# Patient Record
Sex: Male | Born: 1950
Health system: Southern US, Community
[De-identification: ages and names within clinical notes are randomized; demographics above are authoritative.]

## PROBLEM LIST (undated history)

## (undated) DIAGNOSIS — M1990 Unspecified osteoarthritis, unspecified site: Secondary | ICD-10-CM

## (undated) DIAGNOSIS — K4091 Unilateral inguinal hernia, without obstruction or gangrene, recurrent: Secondary | ICD-10-CM

## (undated) DIAGNOSIS — J189 Pneumonia, unspecified organism: Secondary | ICD-10-CM

## (undated) DIAGNOSIS — J339 Nasal polyp, unspecified: Secondary | ICD-10-CM

## (undated) DIAGNOSIS — I351 Nonrheumatic aortic (valve) insufficiency: Secondary | ICD-10-CM

## (undated) DIAGNOSIS — Z9889 Other specified postprocedural states: Secondary | ICD-10-CM

## (undated) DIAGNOSIS — R112 Nausea with vomiting, unspecified: Secondary | ICD-10-CM

## (undated) DIAGNOSIS — J45909 Unspecified asthma, uncomplicated: Secondary | ICD-10-CM

## (undated) HISTORY — DX: Unilateral inguinal hernia, without obstruction or gangrene, recurrent: K40.91

## (undated) HISTORY — PX: NASAL POLYP EXCISION: SHX2068

## (undated) HISTORY — DX: Unspecified asthma, uncomplicated: J45.909

## (undated) HISTORY — PX: EYE SURGERY: SHX253

## (undated) HISTORY — PX: NASAL SEPTUM SURGERY: SHX37

## (undated) HISTORY — DX: Nonrheumatic aortic (valve) insufficiency: I35.1

## (undated) HISTORY — PX: TRIGEMINAL NERVE DECOMPRESSION: SHX2579

## (undated) HISTORY — PX: KNEE SURGERY: SHX244

## (undated) HISTORY — DX: Nasal polyp, unspecified: J33.9

## (undated) HISTORY — PX: INGUINAL HERNIA REPAIR: SUR1180

---
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
## 2001-08-26 HISTORY — PX: OTHER SURGICAL HISTORY: SHX169

## 2006-08-26 HISTORY — PX: REFRACTIVE SURGERY: SHX103

## 2008-10-19 HISTORY — PX: CATARACT EXTRACTION W/ INTRAOCULAR LENS IMPLANT: SHX1309

## 2008-11-09 HISTORY — PX: CATARACT EXTRACTION W/ INTRAOCULAR LENS IMPLANT: SHX1309

## 2017-08-26 HISTORY — PX: XI ROBOTIC ASSISTED INGUINAL HERNIA REPAIR WITH MESH: SHX6706

## 2019-07-16 ENCOUNTER — Ambulatory Visit: Payer: Self-pay | Admitting: Family Medicine

## 2019-08-09 ENCOUNTER — Other Ambulatory Visit: Payer: Self-pay

## 2019-08-09 ENCOUNTER — Encounter: Payer: Self-pay | Admitting: Nurse Practitioner

## 2019-08-09 ENCOUNTER — Ambulatory Visit (INDEPENDENT_AMBULATORY_CARE_PROVIDER_SITE_OTHER): Payer: Medicare Other | Admitting: Nurse Practitioner

## 2019-08-09 VITALS — BP 132/76 | HR 88 | Temp 98.4°F | Ht 71.0 in | Wt 196.4 lb

## 2019-08-09 DIAGNOSIS — J310 Chronic rhinitis: Secondary | ICD-10-CM

## 2019-08-09 DIAGNOSIS — Z9189 Other specified personal risk factors, not elsewhere classified: Secondary | ICD-10-CM

## 2019-08-09 DIAGNOSIS — M170 Bilateral primary osteoarthritis of knee: Secondary | ICD-10-CM

## 2019-08-09 DIAGNOSIS — J4520 Mild intermittent asthma, uncomplicated: Secondary | ICD-10-CM

## 2019-08-09 DIAGNOSIS — G4733 Obstructive sleep apnea (adult) (pediatric): Secondary | ICD-10-CM

## 2019-08-09 DIAGNOSIS — E559 Vitamin D deficiency, unspecified: Secondary | ICD-10-CM

## 2019-08-09 DIAGNOSIS — Q676 Pectus excavatum: Secondary | ICD-10-CM

## 2019-08-09 DIAGNOSIS — I340 Nonrheumatic mitral (valve) insufficiency: Secondary | ICD-10-CM

## 2019-08-09 DIAGNOSIS — R030 Elevated blood-pressure reading, without diagnosis of hypertension: Secondary | ICD-10-CM | POA: Diagnosis not present

## 2019-08-09 DIAGNOSIS — I351 Nonrheumatic aortic (valve) insufficiency: Secondary | ICD-10-CM | POA: Insufficient documentation

## 2019-08-09 DIAGNOSIS — I872 Venous insufficiency (chronic) (peripheral): Secondary | ICD-10-CM

## 2019-08-09 DIAGNOSIS — Z9889 Other specified postprocedural states: Secondary | ICD-10-CM | POA: Insufficient documentation

## 2019-08-09 DIAGNOSIS — E538 Deficiency of other specified B group vitamins: Secondary | ICD-10-CM

## 2019-08-09 DIAGNOSIS — Z800 Family history of malignant neoplasm of digestive organs: Secondary | ICD-10-CM

## 2019-08-09 DIAGNOSIS — Z8719 Personal history of other diseases of the digestive system: Secondary | ICD-10-CM

## 2019-08-09 DIAGNOSIS — J339 Nasal polyp, unspecified: Secondary | ICD-10-CM

## 2019-08-09 DIAGNOSIS — J449 Chronic obstructive pulmonary disease, unspecified: Secondary | ICD-10-CM | POA: Insufficient documentation

## 2019-08-09 NOTE — Progress Notes (Signed)
New Patient Office Visit  Subjective:  Patient ID: Richard Wolfe, male    DOB: 1951/01/17  Age: 68 y.o. MRN: [id number]  CC:  Chief Complaint  Patient presents with  . Establish Care  . Referral    patient requesting referrals to cardio and pulmonary    HPI Richard Wolfe presents for new patient visit to establish care.  Introduced to Publishing rights manager role and practice setting.  All questions answered. Recently moved here from [place], [place], brought records with him.  His previous PCP has written up a provider hand off with pertinent information.   Retired Metallurgist.  Would like referrals today to cardiologist, pulmonary, and ENT, all who were following him closely in [place].  Has three priorities after move into new house in February -- knee replacement bilaterally, colonoscopy (was told he was to have a colonoscopy every 5 years due to family history -- last 2018 & 2019 could not be done due to "not being clean enough"), and get his hernias looked at.  Does not want referrals for these issues or procedures until closer to February.  AORTIC INSUFFICIENCY: Was followed by cardiology in [place] and told to continue this.  On notes it reports moderate to severe aortic insufficiency, "echo in 2018 showed 65% EF with mild to moderate concentric left ventricular hypertrophy, borderline left atrial enlargement, mild to moderate mitral regurgitation and moderate tricuspid regurgitation".  Was to have repeat echo in 2020, but has not obtained this as of yet.  Reports having white coat syndrome.  BP at home 120-130/70's. Aspirin: no Recurrent headaches: no Visual changes: no Palpitations: no Dyspnea: no Chest pain: no Lower extremity edema: no Dizzy/lightheaded: no  ASTHMA, MILD, INTERMITTENT Was followed by pulmonary in [place] for asthma.  Has had this since childhood.  Never had any asthma related issues until 4-5 years ago, is a runner  and felt like he was not getting enough oxygen and then was sent to pulmonary.  Had PFTs once a year and was told test results showed he had moderate blockage.  Uses Symbicort inhaler daily. Has Albuterol inhaler, but rarely uses.  States he fills it once a year, but only uses once or twice a year.   Does not use CPAP, has not used in 1 1/2 to 2 years.  Is aware he would benefit from daily use of this. Asthma status: stable Satisfied with current treatment?: yes Albuterol/rescue inhaler frequency: once or twice a year Dyspnea frequency: none Wheezing frequency: none Cough frequency: none Nocturnal symptom frequency: none Limitation of activity: no Current upper respiratory symptoms: no Triggers: exercise Home peak flows:none Last Spirometry:  Failed/intolerant to following asthma meds:  Asthma meds in past:  Aerochamber/spacer use: no Visits to ER or Urgent Care in past year: no Pneumovax: Up to Date Influenza: Up to Date   NASAL POLYPS & CHRONIC RHINITIS: Had to have nasal polyps removed every 18 months, now uses Neti Pot and Pulmicort neb, which his ENT started, and has decreased the amount of polyps he has.  Has not had nasal polyp surgery since 2000.  Was followed by ENT in [place] and requests to establish care with ENT locally in case polyp presents in future.  INGUINAL AND UMBILICAL HERNIA: Had triple hernia surgery June 19, 2018, inguinal and umbilical hernias.  Since that time has been gradually feeling pressure when he sits down, swelling, pulling on left side, and feeling like someone stuffed rags under skin.  L>R, left with  tenderness at times.  Does endorse lots of lifting due to moving over past months.  Does wear supportive underwear daily.  Would like to get this assessed, but prefers not to have assessment until February after has moved into new home and has more time.  PERIPHERAL VENOUS INSUFFICIENCY: Wears compression socks, his previous PCP told him he has good  circulation.  Has varicose veins bilaterally.  Is religious about wearing compression socks all the time, which has improved overall edema and veins.   ROSACEA: Takes Doxycycline as maintenance for rosacea.  Has not had flare up since starting abx for control several years ago.   Itching: no Burning: no Redness: no Oozing: no Scaling: no Blisters: no Painful: no Fevers: no   OSTEOARTHRITIS BOTH KNEES: Per previous PCP notes this has been slowly progressing and impacts patient's abilities to perform daily ADLs.  He was followed by Dr. Lars Pinks in [place] and per patient ortho recommended to forgo injections and instead have bilateral knee replacement.  Patient would like referral to ortho in future, after his move in February, for further assessment and to schedule replacement.  At this time uses intermittent Tylenol for discomfort and remains active to avoid complications.  Endorses being a very active person, bowling, running, and working outside.  VITAMIN B12 & D DEFICIENCY: Continues on supplements for both.  No recent fractures or falls.  Reports history of low levels of Vit D and B12.     FAMILY HISTORY OF COLON CANCER: Father and brother with history of this.  Per patient he is to have colonoscopy every 5 years and was unable to obtain these past two years due to "not being clean enough".  Has history of colon polyps on past tests.  Would like referral in future, in February, for GI to continue ongoing assessment.  Reports regular bowel pattern.  Denies melena, diarrhea, abdominal pain, constipation, hemorrhoids, or N&V.  Past Medical History:  Diagnosis Date  . Hernia, inguinal, recurrent   . Nasal polyps     Past Surgical History:  Procedure Laterality Date  . CATARACT EXTRACTION W/ INTRAOCULAR LENS IMPLANT Left 10/19/2008  . CATARACT EXTRACTION W/ INTRAOCULAR LENS IMPLANT Right 11/09/2008  . INGUINAL HERNIA REPAIR Left    1999  . INGUINAL HERNIA REPAIR Right    2019  . NASAL  POLYP EXCISION     1981, 1986, 1993, 1994, 1995, 1996, 1998, 1999, 2001  . NASAL SEPTUM SURGERY     1981  . REFRACTIVE SURGERY  2008  . trigeminal nerve block  2003  . TRIGEMINAL NERVE DECOMPRESSION  2002, 2003  . WISDOM TOOTH EXTRACTION  1967  . XI ROBOTIC ASSISTED INGUINAL HERNIA REPAIR WITH MESH Left 2019    Family History  Problem Relation Age of Onset  . Rheum arthritis Mother   . Cancer Father        liver  . Arthritis Brother   . Cancer Paternal Grandfather        throat  . Cancer Brother        liver    Social History   Socioeconomic History  . Marital status: Married    Spouse name: Not on file  . Number of children: Not on file  . Years of education: Not on file  . Highest education level: Not on file  Occupational History  . Not on file  Tobacco Use  . Smoking status: Never Smoker  . Smokeless tobacco: Never Used  Substance and Sexual Activity  . Alcohol  use: Never  . Drug use: Never  . Sexual activity: Yes  Other Topics Concern  . Not on file  Social History Narrative  . Not on file   Social Determinants of Health   Financial Resource Strain: Low Risk   . Difficulty of Paying Living Expenses: Not hard at all  Food Insecurity: No Food Insecurity  . Worried About Programme researcher, broadcasting/film/video in the Last Year: Never true  . Ran Out of Food in the Last Year: Never true  Transportation Needs: No Transportation Needs  . Lack of Transportation (Medical): No  . Lack of Transportation (Non-Medical): No  Physical Activity: Sufficiently Active  . Days of Exercise per Week: 5 days  . Minutes of Exercise per Session: 30 min  Stress: No Stress Concern Present  . Feeling of Stress : Not at all  Social Connections: Somewhat Isolated  . Frequency of Communication with Friends and Family: More than three times a week  . Frequency of Social Gatherings with Friends and Family: More than three times a week  . Attends Religious Services: Never  . Active Member of Clubs or  Organizations: No  . Attends Banker Meetings: Never  . Marital Status: Married  Catering manager Violence: Not At Risk  . Fear of Current or Ex-Partner: No  . Emotionally Abused: No  . Physically Abused: No  . Sexually Abused: No    ROS Review of Systems  Constitutional: Negative for activity change, diaphoresis, fatigue and fever.  Respiratory: Negative for cough, chest tightness, shortness of breath and wheezing.   Cardiovascular: Negative for chest pain, palpitations and leg swelling.  Gastrointestinal: Negative for abdominal distention, abdominal pain, constipation, diarrhea, nausea and vomiting.  Endocrine: Negative for cold intolerance and heat intolerance.  Musculoskeletal: Positive for arthralgias.  Neurological: Negative.   Psychiatric/Behavioral: Negative.     Objective:   Today's Vitals: BP 132/76 (BP Location: Right Arm)   Pulse 88   Temp 98.4 F (36.9 C) (Oral)   Ht  (1.803 m)   Wt 196 lb 6.4 oz (89.1 kg)   SpO2 95%   BMI 27.39 kg/m   Physical Exam Vitals and nursing note reviewed.  Constitutional:      General: He is awake. He is not in acute distress.    Appearance: He is well-developed. He is not ill-appearing.  HENT:     Head: Normocephalic and atraumatic.     Right Ear: Hearing normal. No drainage.     Left Ear: Hearing normal. No drainage.  Eyes:     General: Lids are normal.        Right eye: No discharge.        Left eye: No discharge.     Conjunctiva/sclera: Conjunctivae normal.     Pupils: Pupils are equal, round, and reactive to light.  Neck:     Thyroid: No thyromegaly.     Vascular: No carotid bruit.  Cardiovascular:     Rate and Rhythm: Normal rate and regular rhythm.     Heart sounds: S1 normal and S2 normal. Murmur present. Systolic murmur present with a grade of 1/6. No gallop.      Comments: Compression hose in place. Pulmonary:     Effort: Pulmonary effort is normal. No accessory muscle usage or respiratory  distress.     Breath sounds: Normal breath sounds.     Comments: Slight pectus excavatum noted. Abdominal:     General: Bowel sounds are normal.     Palpations:  Abdomen is soft. There is no hepatomegaly or splenomegaly.     Tenderness: There is no abdominal tenderness.     Comments: Mild swelling to bilateral inguinal area with no tenderness on palpation and no masses noted.    Musculoskeletal:        General: Normal range of motion.     Cervical back: Normal range of motion and neck supple.     Right lower leg: No edema.     Left lower leg: No edema.  Lymphadenopathy:     Head:     Right side of head: No submental, submandibular, tonsillar, preauricular or posterior auricular adenopathy.     Left side of head: No submental, submandibular, tonsillar, preauricular or posterior auricular adenopathy.  Skin:    General: Skin is warm and dry.     Capillary Refill: Capillary refill takes less than 2 seconds.     Findings: No rash.  Neurological:     Mental Status: He is alert and oriented to person, place, and time.     Deep Tendon Reflexes: Reflexes are normal and symmetric.     Reflex Scores:      Brachioradialis reflexes are 2+ on the right side and 2+ on the left side.      Patellar reflexes are 2+ on the right side and 2+ on the left side. Psychiatric:        Attention and Perception: Attention normal.        Mood and Affect: Mood normal.        Speech: Speech normal.        Behavior: Behavior normal. Behavior is cooperative.        Thought Content: Thought content normal.        Judgment: Judgment normal.     Assessment & Plan:   Problem List Items Addressed This Visit      Cardiovascular and Mediastinum   White coat syndrome without diagnosis of hypertension    Initial BP elevated, but repeat below goal.  No current medications.  Continue diet focus.      Relevant Orders   Ambulatory referral to Cardiology   Aortic insufficiency - Primary    Was followed by  cardiology in [place].  Will place referral to cardiology to continue this collaboration per patient request.  Is due for repeat echo.      Relevant Orders   Ambulatory referral to Cardiology   Mild mitral regurgitation by prior echocardiogram    Followed by cardiology in [place] and he would like to continue this collaboration, will place referral per request.  No current symptoms.      Relevant Orders   Ambulatory referral to Cardiology   Peripheral venous insufficiency    Chronic, ongoing.  Continue use of compression hose daily and Tylenol as needed.  Consider vascular referral if any worsening.        Respiratory   Nasal polyps    None present in several years, but would like to establish care with ENT locally as was followed closely in [place].  Will place referral for ongoing collaboration.  Continue Pulmicort nebulizer and neti pot treatments.      Relevant Orders   Ambulatory referral to ENT   Chronic rhinitis    Followed by ENT in [place] and is requesting referral to continue this collaboration.  Referral placed.  Continue Pulmicort nebulizer and neti pot treatments.      Relevant Orders   Ambulatory referral to ENT   Ambulatory referral to Pulmonology  Mild intermittent asthma, uncomplicated    Mild, intermittent with no recent exacerbations.  Followed by pulmonary in [place] and is requesting referral to establish care with pulmonary locally.  Referral placed.  Continue Symbicort daily + Proventil as needed.  Perform PFT next visit.      Relevant Medications   budesonide (PULMICORT) 0.5 MG/2ML nebulizer solution   SYMBICORT 80-4.5 MCG/ACT inhaler   albuterol (VENTOLIN HFA) 108 (90 Base) MCG/ACT inhaler   Other Relevant Orders   Ambulatory referral to Pulmonology   OSA (obstructive sleep apnea)    Has not used CPAP in 1 1/2 to 2 years.  Have recommended he return to use of CPAP regularly for benefit to overall health.      Relevant Orders   Ambulatory referral to  Pulmonology     Musculoskeletal and Integument   Osteoarthritis of both knees    Ongoing, plan on imaging in upcoming month and patient wishes to have referral to ortho placed at next visit.  His goal is total knee replacement bilaterally in upcoming months.  Continue Tylenol as needed and regular exercise + stretching.      Pectus excavatum    Noted on exam today.  Continue to monitor.  No family history of this reported.        Other   Family history of colon cancer    Is due for screening colonoscopy, wishes to have referral placed next visit to GI.  Continue regular screening.      History of inguinal hernia repair    History of bilateral inguinal hernia and umbilical hernia repair in 2019.  He wishes to have this assessed due to mild return of previous symptoms.  Does not want referral this visit, but would like next visit.  Is aware to return to office immediately if any worsening symptoms prior to next visit.  Continue to wear support at home and attempt to avoid heavy lifting.      B12 deficiency    Check B12 level today and continue daily supplement.      Relevant Orders   Vitamin B12   CBC with Differential/Platelet out   Vitamin D deficiency    Check Vit D level today and continue daily supplement.      Relevant Orders   Vit D  25 hydroxy (rtn osteoporosis monitoring)    Other Visit Diagnoses    At risk for cardiac complication       Check CBC, lipid, and CMP today due to cardiac history.  He is fasting.   Relevant Orders   Comprehensive metabolic panel   Lipid Panel w/o Chol/HDL Ratio out      Outpatient Encounter Medications as of 08/09/2019  Medication Sig  . albuterol (VENTOLIN HFA) 108 (90 Base) MCG/ACT inhaler Inhale 2 puffs into the lungs every 6 (six) hours as needed.  . Ascorbic Acid (VITAMIN C) 1000 MG tablet Take 1,000 mg by mouth daily.  . budesonide (PULMICORT) 0.5 MG/2ML nebulizer solution Take by nebulization 2 (two) times daily.  .  Cholecalciferol (VITAMIN D3) 125 MCG (5000 UT) CAPS Take 1 capsule by mouth daily.  . DHA-EPA-Vitamin E (OMEGA-3 COMPLEX PO) Take 1,280 mg by mouth daily.  Marland Kitchen. doxycycline (PERIOSTAT) 20 MG tablet Take 20 mg by mouth 2 (two) times daily.  . Folic Acid (FOLATE PO) Take 400 mcg by mouth daily.  . Magnesium 400 MG CAPS Take 2 capsules by mouth daily.  . milk thistle 175 MG tablet Take 350 mg by mouth daily.  .Marland Kitchen  Misc Natural Products (JOINT HEALTH PO) Take 250 mg by mouth daily.  . SYMBICORT 80-4.5 MCG/ACT inhaler Inhale 1 puff into the lungs 2 (two) times daily.  . TURMERIC PO Take 20,000 Units by mouth daily.  Marland Kitchen UNABLE TO FIND Take 100 mg by mouth daily. Ubiquinal  . vitamin B-12 (CYANOCOBALAMIN) 1000 MCG tablet Take 1,000 mcg by mouth daily.   No facility-administered encounter medications on file as of 08/09/2019.    Follow-up: Return in about 5 weeks (around 09/13/2019) for Physical exam with labs.   Marjie Skiff, NP

## 2019-08-09 NOTE — Patient Instructions (Signed)
Asthma, Adult ° °Asthma is a long-term (chronic) condition in which the airways get tight and narrow. The airways are the breathing passages that lead from the nose and mouth down into the lungs. A person with asthma will have times when symptoms get worse. These are called asthma attacks. They can cause coughing, whistling sounds when you breathe (wheezing), shortness of breath, and chest pain. They can make it hard to breathe. There is no cure for asthma, but medicines and lifestyle changes can help control it. °There are many things that can bring on an asthma attack or make asthma symptoms worse (triggers). Common triggers include: °· Mold. °· Dust. °· Cigarette smoke. °· Cockroaches. °· Things that can cause allergy symptoms (allergens). These include animal skin flakes (dander) and pollen from trees or grass. °· Things that pollute the air. These may include household cleaners, wood smoke, smog, or chemical odors. °· Cold air, weather changes, and wind. °· Crying or laughing hard. °· Stress. °· Certain medicines or drugs. °· Certain foods such as dried fruit, potato chips, and grape juice. °· Infections, such as a cold or the flu. °· Certain medical conditions or diseases. °· Exercise or tiring activities. °Asthma may be treated with medicines and by staying away from the things that cause asthma attacks. Types of medicines may include: °· Controller medicines. These help prevent asthma symptoms. They are usually taken every day. °· Fast-acting reliever or rescue medicines. These quickly relieve asthma symptoms. They are used as needed and provide short-term relief. °· Allergy medicines if your attacks are brought on by allergens. °· Medicines to help control the body's defense (immune) system. °Follow these instructions at home: °Avoiding triggers in your home °· Change your heating and air conditioning filter often. °· Limit your use of fireplaces and wood stoves. °· Get rid of pests (such as roaches and  mice) and their droppings. °· Throw away plants if you see mold on them. °· Clean your floors. Dust regularly. Use cleaning products that do not smell. °· Have someone vacuum when you are not home. Use a vacuum cleaner with a HEPA filter if possible. °· Replace carpet with wood, tile, or vinyl flooring. Carpet can trap animal skin flakes and dust. °· Use allergy-proof pillows, mattress covers, and box spring covers. °· Wash bed sheets and blankets every week in hot water. Dry them in a dryer. °· Keep your bedroom free of any triggers. °· Avoid pets and keep windows closed when things that cause allergy symptoms are in the air. °· Use blankets that are made of polyester or cotton. °· Clean bathrooms and kitchens with bleach. If possible, have someone repaint the walls in these rooms with mold-resistant paint. Keep out of the rooms that are being cleaned and painted. °· Wash your hands often with soap and water. If soap and water are not available, use hand sanitizer. °· Do not allow anyone to smoke in your home. °General instructions °· Take over-the-counter and prescription medicines only as told by your doctor. °? Talk with your doctor if you have questions about how or when to take your medicines. °? Make note if you need to use your medicines more often than usual. °· Do not use any products that contain nicotine or tobacco, such as cigarettes and e-cigarettes. If you need help quitting, ask your doctor. °· Stay away from secondhand smoke. °· Avoid doing things outdoors when allergen counts are high and when air quality is low. °· Wear a ski mask   when doing outdoor activities in the winter. The mask should cover your nose and mouth. Exercise indoors on cold days if you can. °· Warm up before you exercise. Take time to cool down after exercise. °· Use a peak flow meter as told by your doctor. A peak flow meter is a tool that measures how well the lungs are working. °· Keep track of the peak flow meter's readings.  Write them down. °· Follow your asthma action plan. This is a written plan for taking care of your asthma and treating your attacks. °· Make sure you get all the shots (vaccines) that your doctor recommends. Ask your doctor about a flu shot and a pneumonia shot. °· Keep all follow-up visits as told by your doctor. This is important. °Contact a doctor if: °· You have wheezing, shortness of breath, or a cough even while taking medicine to prevent attacks. °· The mucus you cough up (sputum) is thicker than usual. °· The mucus you cough up changes from clear or white to yellow, green, gray, or bloody. °· You have problems from the medicine you are taking, such as: °? A rash. °? Itching. °? Swelling. °? Trouble breathing. °· You need reliever medicines more than 2-3 times a week. °· Your peak flow reading is still at 50-79% of your personal best after following the action plan for 1 hour. °· You have a fever. °Get help right away if: °· You seem to be worse and are not responding to medicine during an asthma attack. °· You are short of breath even at rest. °· You get short of breath when doing very little activity. °· You have trouble eating, drinking, or talking. °· You have chest pain or tightness. °· You have a fast heartbeat. °· Your lips or fingernails start to turn blue. °· You are light-headed or dizzy, or you faint. °· Your peak flow is less than 50% of your personal best. °· You feel too tired to breathe normally. °Summary °· Asthma is a long-term (chronic) condition in which the airways get tight and narrow. An asthma attack can make it hard to breathe. °· Asthma cannot be cured, but medicines and lifestyle changes can help control it. °· Make sure you understand how to avoid triggers and how and when to use your medicines. °This information is not intended to replace advice given to you by your health care provider. Make sure you discuss any questions you have with your health care provider. °Document  Released: 01/29/2008 Document Revised: 10/15/2018 Document Reviewed: 09/16/2016 °Elsevier Patient Education © 2020 [employer] ° °

## 2019-08-09 NOTE — Assessment & Plan Note (Signed)
Followed by cardiology in [place] and he would like to continue this collaboration, will place referral per request.  No current symptoms.

## 2019-08-09 NOTE — Assessment & Plan Note (Signed)
Noted on exam today.  Continue to monitor.  No family history of this reported.

## 2019-08-09 NOTE — Assessment & Plan Note (Signed)
Ongoing, plan on imaging in upcoming month and patient wishes to have referral to ortho placed at next visit.  His goal is total knee replacement bilaterally in upcoming months.  Continue Tylenol as needed and regular exercise + stretching.

## 2019-08-09 NOTE — Assessment & Plan Note (Signed)
None present in several years, but would like to establish care with ENT locally as was followed closely in [place].  Will place referral for ongoing collaboration.  Continue Pulmicort nebulizer and neti pot treatments.

## 2019-08-09 NOTE — Assessment & Plan Note (Signed)
Chronic, ongoing.  Continue use of compression hose daily and Tylenol as needed.  Consider vascular referral if any worsening.

## 2019-08-09 NOTE — Assessment & Plan Note (Signed)
Is due for screening colonoscopy, wishes to have referral placed next visit to GI.  Continue regular screening.

## 2019-08-09 NOTE — Assessment & Plan Note (Signed)
Has not used CPAP in 1 1/2 to 2 years.  Have recommended he return to use of CPAP regularly for benefit to overall health. 

## 2019-08-09 NOTE — Assessment & Plan Note (Signed)
Initial BP elevated, but repeat below goal.  No current medications.  Continue diet focus.

## 2019-08-09 NOTE — Assessment & Plan Note (Signed)
Followed by ENT in [place] and is requesting referral to continue this collaboration.  Referral placed.  Continue Pulmicort nebulizer and neti pot treatments.

## 2019-08-09 NOTE — Assessment & Plan Note (Signed)
Mild, intermittent with no recent exacerbations.  Followed by pulmonary in [place] and is requesting referral to establish care with pulmonary locally.  Referral placed.  Continue Symbicort daily + Proventil as needed.  Perform PFT next visit.

## 2019-08-09 NOTE — Assessment & Plan Note (Signed)
Check B12 level today and continue daily supplement. 

## 2019-08-09 NOTE — Assessment & Plan Note (Signed)
Was followed by cardiology in [place].  Will place referral to cardiology to continue this collaboration per patient request.  Is due for repeat echo.

## 2019-08-09 NOTE — Assessment & Plan Note (Signed)
Check Vit D level today and continue daily supplement. 

## 2019-08-09 NOTE — Assessment & Plan Note (Addendum)
History of bilateral inguinal hernia and umbilical hernia repair in 2019.  He wishes to have this assessed due to mild return of previous symptoms.  Does not want referral this visit, but would like next visit.  Is aware to return to office immediately if any worsening symptoms prior to next visit.  Continue to wear support at home and attempt to avoid heavy lifting.

## 2019-08-10 LAB — VITAMIN D 25 HYDROXY (VIT D DEFICIENCY, FRACTURES): Vit D, 25-Hydroxy: 40.2 ng/mL (ref 30.0–100.0)

## 2019-08-10 LAB — CBC WITH DIFFERENTIAL/PLATELET
Basophils Absolute: 0 10*3/uL (ref 0.0–0.2)
Basos: 0 %
EOS (ABSOLUTE): 0.1 10*3/uL (ref 0.0–0.4)
Eos: 1 %
Hematocrit: 48 % (ref 37.5–51.0)
Hemoglobin: 16 g/dL (ref 13.0–17.7)
Immature Grans (Abs): 0 10*3/uL (ref 0.0–0.1)
Immature Granulocytes: 0 %
Lymphocytes Absolute: 1.2 10*3/uL (ref 0.7–3.1)
Lymphs: 18 %
MCH: 31.4 pg (ref 26.6–33.0)
MCHC: 33.3 g/dL (ref 31.5–35.7)
MCV: 94 fL (ref 79–97)
Monocytes Absolute: 0.4 10*3/uL (ref 0.1–0.9)
Monocytes: 7 %
Neutrophils Absolute: 5 10*3/uL (ref 1.4–7.0)
Neutrophils: 74 %
Platelets: 202 10*3/uL (ref 150–450)
RBC: 5.09 x10E6/uL (ref 4.14–5.80)
RDW: 13.2 % (ref 11.6–15.4)
WBC: 6.7 10*3/uL (ref 3.4–10.8)

## 2019-08-10 LAB — COMPREHENSIVE METABOLIC PANEL
ALT: 34 IU/L (ref 0–44)
AST: 35 IU/L (ref 0–40)
Albumin/Globulin Ratio: 1.3 (ref 1.2–2.2)
Albumin: 4.3 g/dL (ref 3.8–4.8)
Alkaline Phosphatase: 68 IU/L (ref 39–117)
BUN/Creatinine Ratio: 23 (ref 10–24)
BUN: 23 mg/dL (ref 8–27)
Bilirubin Total: 1.5 mg/dL — ABNORMAL HIGH (ref 0.0–1.2)
CO2: 24 mmol/L (ref 20–29)
Calcium: 9.6 mg/dL (ref 8.6–10.2)
Chloride: 101 mmol/L (ref 96–106)
Creatinine, Ser: 1 mg/dL (ref 0.76–1.27)
GFR calc Af Amer: 88 mL/min/{1.73_m2} (ref >59)
GFR calc non Af Amer: 76 mL/min/{1.73_m2} (ref >59)
Globulin, Total: 3.3 g/dL (ref 1.5–4.5)
Glucose: 88 mg/dL (ref 65–99)
Potassium: 4.2 mmol/L (ref 3.5–5.2)
Sodium: 140 mmol/L (ref 134–144)
Total Protein: 7.6 g/dL (ref 6.0–8.5)

## 2019-08-10 LAB — LIPID PANEL W/O CHOL/HDL RATIO
Cholesterol, Total: 161 mg/dL (ref 100–199)
HDL: 50 mg/dL (ref >39)
LDL Chol Calc (NIH): 96 mg/dL (ref 0–99)
Triglycerides: 80 mg/dL (ref 0–149)
VLDL Cholesterol Cal: 15 mg/dL (ref 5–40)

## 2019-08-10 LAB — VITAMIN B12: Vitamin B-12: >2000 pg/mL — ABNORMAL HIGH (ref 232–1245)

## 2019-08-10 NOTE — Progress Notes (Signed)
Please let Richard Wolfe know that all labs returned within normal ranges, including B12 and Vitamin D.  Great job!!

## 2019-09-01 ENCOUNTER — Ambulatory Visit (INDEPENDENT_AMBULATORY_CARE_PROVIDER_SITE_OTHER): Payer: Medicare Other | Admitting: Internal Medicine

## 2019-09-01 ENCOUNTER — Other Ambulatory Visit: Payer: Self-pay

## 2019-09-01 ENCOUNTER — Encounter: Payer: Self-pay | Admitting: Internal Medicine

## 2019-09-01 VITALS — BP 152/84 | HR 82 | Ht 71.0 in | Wt 198.5 lb

## 2019-09-01 DIAGNOSIS — R030 Elevated blood-pressure reading, without diagnosis of hypertension: Secondary | ICD-10-CM

## 2019-09-01 DIAGNOSIS — I351 Nonrheumatic aortic (valve) insufficiency: Secondary | ICD-10-CM | POA: Diagnosis not present

## 2019-09-01 NOTE — Progress Notes (Signed)
New Outpatient Visit Date: 09/01/2019  Referring Provider: Venita Lick, NP [address] [place],  [place] [address]  Chief Complaint: Establish cardiology care  HPI:  Richard Wolfe is a 69 y.o. male who is being seen today for the evaluation of aortic insufficiency at the request of Ms. Cannady.  He recently moved to the area from [place], [place], and would like to establish a cardiologist in the area. He has a history of moderate to severe aortic regurgitation by echo in 2018, as well as asthma, recurrent nasal polyps, borderline obstructive sleep apnea, arthritis and venous insufficiency.  Today, Richard Wolfe reports feeling well.  He notes bad allergies dating back to childhood, as well as asthma.  However, these symptoms have been well-controlled.  He denies chest pain, shortness of breath, chest pain, palpitations, and lightheadedness.  Other than intermittent knee swelling, he denies edema.  He reports "white coat" hypertension over the last few years, with consistently normal readings at home.  He had been using CPAP regularly prior to moving to [place], but his machine is currently in storage.  --------------------------------------------------------------------------------------------------  Cardiovascular History & Procedures: Cardiovascular Problems:  Aortic regurgitation  Risk Factors:  Male gender and age > 22  Cath/PCI:  None  CV Surgery:  None  EP Procedures and Devices:  None  Non-Invasive Evaluation(s):  None available; records suggest moderate to severe aortic regurgitation (last echo in 2018 in [place], [place])  Recent CV Pertinent Labs: Lab Results  Component Value Date   CHOL 161 08/09/2019   HDL 50 08/09/2019   LDLCALC 96 08/09/2019   TRIG 80 08/09/2019   K 4.2 08/09/2019   BUN 23 08/09/2019   CREATININE 1.00 08/09/2019    --------------------------------------------------------------------------------------------------  Past Medical History:  Diagnosis  Date  . Aortic regurgitation   . Hernia, inguinal, recurrent   . Nasal polyps     Past Surgical History:  Procedure Laterality Date  . CATARACT EXTRACTION W/ INTRAOCULAR LENS IMPLANT Left 10/19/2008  . CATARACT EXTRACTION W/ INTRAOCULAR LENS IMPLANT Right 11/09/2008  . INGUINAL HERNIA REPAIR Left    1999  . INGUINAL HERNIA REPAIR Right    2019  . [place], [place], 1998, 1999, 2001  . NASAL SEPTUM SURGERY     1981  . REFRACTIVE SURGERY  2008  . trigeminal nerve block  2003  . TRIGEMINAL NERVE DECOMPRESSION  2002, 2003  . [place]  . XI ROBOTIC ASSISTED INGUINAL HERNIA REPAIR WITH MESH Left 2019    Current Meds  Medication Sig  . albuterol (VENTOLIN HFA) 108 (90 Base) MCG/ACT inhaler Inhale 2 puffs into the lungs every 6 (six) hours as needed.  . Ascorbic Acid (VITAMIN C) 1000 MG tablet Take 1,000 mg by mouth daily.  . budesonide (PULMICORT) 0.5 MG/2ML nebulizer solution Take by nebulization 2 (two) times daily.  . Cholecalciferol (VITAMIN D3) 125 MCG (5000 UT) CAPS Take 1 capsule by mouth daily.  . DHA-EPA-Vitamin E (OMEGA-3 COMPLEX PO) Take 1,280 mg by mouth daily.  Marland Kitchen doxycycline (PERIOSTAT) 20 MG tablet Take 20 mg by mouth 2 (two) times daily.  . Folic Acid (FOLATE PO) Take 400 mcg by mouth daily.  . Magnesium 400 MG CAPS Take 2 capsules by mouth daily.  . milk thistle 175 MG tablet Take 350 mg by mouth daily.  . Misc Natural Products (JOINT HEALTH PO) Take 250 mg by mouth daily.  . SYMBICORT 80-4.5 MCG/ACT inhaler  Inhale 1 puff into the lungs 2 (two) times daily.  . TURMERIC PO Take 20,000 Units by mouth daily.    Allergies: Penicillins and Sulfur  Social History   Tobacco Use  . Smoking status: Never Smoker  . Smokeless tobacco: Never Used  Substance Use Topics  . Alcohol use: Yes    Alcohol/week: 2.0 standard drinks    Types: 1 Glasses of wine, 1 Cans of beer per week  . Drug use: Never     Family History  Problem Relation Age of Onset  . Rheum arthritis Mother   . Cancer Father        liver  . Arthritis Brother   . Cancer Paternal Grandfather        throat  . Cancer Brother        liver  . Heart disease Half-Brother        Pacemaker/ICD  . Heart failure Half-Brother     Review of Systems: A 12-system review of systems was performed and was negative except as noted in the HPI.  --------------------------------------------------------------------------------------------------  Physical Exam: BP (!) 152/84 (BP Location: Right Arm, Patient Position: Sitting, Cuff Size: Normal)   Pulse 82   Ht 5\' 11"  (1.803 m)   Wt 198 lb 8 oz (90 kg)   SpO2 98%   BMI 27.69 kg/m   General:  NAD. HEENT: No conjunctival pallor or scleral icterus. Facemask in place. Neck: Supple without lymphadenopathy, thyromegaly, JVD, or HJR. No carotid bruit. Lungs: Normal work of breathing. Clear to auscultation bilaterally without wheezes or crackles. Heart: Regular rate and rhythm with occasional extrasystoles. 1/6 systolic and early diastolic murmurs loudest at the base. Non-displaced PMI. Abd: Bowel sounds present. Soft, NT/ND without hepatosplenomegaly Ext: No lower extremity edema. Radial, PT, and DP pulses are 2+ bilaterally Skin: Warm and dry without rash. Neuro: CNIII-XII intact. Strength and fine-touch sensation intact in upper and lower extremities bilaterally. Psych: Normal mood and affect.  EKG:  NSR with borderline LVH and nonspecific ST changes.  Lab Results  Component Value Date   WBC 6.7 08/09/2019   HGB 16.0 08/09/2019   HCT 48.0 08/09/2019   MCV 94 08/09/2019   PLT 202 08/09/2019    Lab Results  Component Value Date   NA 140 08/09/2019   K 4.2 08/09/2019   CL 101 08/09/2019   CO2 24 08/09/2019   BUN 23 08/09/2019   CREATININE 1.00 08/09/2019   GLUCOSE 88 08/09/2019   ALT 34 08/09/2019    Lab Results  Component Value Date   CHOL 161 08/09/2019   HDL  50 08/09/2019   LDLCALC 96 08/09/2019   TRIG 80 08/09/2019     --------------------------------------------------------------------------------------------------  ASSESSMENT AND PLAN: Aortic regurgitation: Richard Wolfe is asymptomatic and without signs of heart failure.  Records suggest moderate to severe AI.  Exam today is notable for soft systolic and early diastolic murmurs.  We will obtain an echocardiogram at the patient's earliest convenience.  Importance of BP control was discussed to prevent progression of AI and development of cardiomyopathy.  White coat hypertension: BP moderately elevated today, though PCP and home readings have been better.  I encouraged sodium restriction and continued home monitoring.  No medication changes at this time.  Follow-up: Return to clinic in 6 months.  Rudie Meyer, MD 09/02/2019 10:53 PM

## 2019-09-01 NOTE — Patient Instructions (Signed)
Handout on DASH diet provided to patient.   Medication Instructions:  Your physician recommends that you continue on your current medications as directed. Please refer to the Current Medication list given to you today.  *If you need a refill on your cardiac medications before your next appointment, please call your pharmacy*  Lab Work: none If you have labs (blood work) drawn today and your tests are completely normal, you will receive your results only by: Marland Kitchen MyChart Message (if you have MyChart) OR . A paper copy in the mail If you have any lab test that is abnormal or we need to change your treatment, we will call you to review the results.  Testing/Procedures: Your physician has requested that you have an echocardiogram. Echocardiography is a painless test that uses sound waves to create images of your heart. It provides your doctor with information about the size and shape of your heart and how well your heart's chambers and valves are working. This procedure takes approximately one hour. There are no restrictions for this procedure.  You may get an IV, if needed, to receive an ultrasound enhancing agent through to better visualize your heart.   Follow-Up: At [facility], you and your health needs are our priority.  As part of our continuing mission to provide you with exceptional heart care, we have created designated Provider Care Teams.  These Care Teams include your primary Cardiologist (physician) and Advanced Practice Providers (APPs -  Physician Assistants and Nurse Practitioners) who all work together to provide you with the care you need, when you need it.  Your next appointment:   6 month(s)  The format for your next appointment:   In Person  Provider:    You may see DR Harrell Gave END or one of the following Advanced Practice Providers on your designated Care Team:    Murray Hodgkins, NP  Christell Faith, PA-C  Marrianne Mood, PA-C   Echocardiogram An  echocardiogram is a procedure that uses painless sound waves (ultrasound) to produce an image of the heart. Images from an echocardiogram can provide important information about:  Signs of coronary artery disease (CAD).  Aneurysm detection. An aneurysm is a weak or damaged part of an artery wall that bulges out from the normal force of blood pumping through the body.  Heart size and shape. Changes in the size or shape of the heart can be associated with certain conditions, including heart failure, aneurysm, and CAD.  Heart muscle function.  Heart valve function.  Signs of a past heart attack.  Fluid buildup around the heart.  Thickening of the heart muscle.  A tumor or infectious growth around the heart valves. Tell a health care provider about:  Any allergies you have.  All medicines you are taking, including vitamins, herbs, eye drops, creams, and over-the-counter medicines.  Any blood disorders you have.  Any surgeries you have had.  Any medical conditions you have.  Whether you are pregnant or may be pregnant. What are the risks? Generally, this is a safe procedure. However, problems may occur, including:  Allergic reaction to dye (contrast) that may be used during the procedure. What happens before the procedure? No specific preparation is needed. You may eat and drink normally. What happens during the procedure?   An IV tube may be inserted into one of your veins.  You may receive contrast through this tube. A contrast is an injection that improves the quality of the pictures from your heart.  A gel will  be applied to your chest.  A wand-like tool (transducer) will be moved over your chest. The gel will help to transmit the sound waves from the transducer.  The sound waves will harmlessly bounce off of your heart to allow the heart images to be captured in real-time motion. The images will be recorded on a computer. The procedure may vary among health care  providers and hospitals. What happens after the procedure?  You may return to your normal, everyday life, including diet, activities, and medicines, unless your health care provider tells you not to do that. Summary  An echocardiogram is a procedure that uses painless sound waves (ultrasound) to produce an image of the heart.  Images from an echocardiogram can provide important information about the size and shape of your heart, heart muscle function, heart valve function, and fluid buildup around your heart.  You do not need to do anything to prepare before this procedure. You may eat and drink normally.  After the echocardiogram is completed, you may return to your normal, everyday life, unless your health care provider tells you not to do that. This information is not intended to replace advice given to you by your health care provider. Make sure you discuss any questions you have with your health care provider. Document Revised: 12/03/2018 Document Reviewed: 09/14/2016 Elsevier Patient Education  2020 [employer].

## 2019-09-02 ENCOUNTER — Encounter: Payer: Self-pay | Admitting: Internal Medicine

## 2019-09-17 ENCOUNTER — Ambulatory Visit: Payer: Self-pay | Admitting: Nurse Practitioner

## 2019-09-24 ENCOUNTER — Telehealth: Payer: Self-pay

## 2019-09-24 NOTE — Telephone Encounter (Signed)
Copied from CRM [id number]. Topic: General - Inquiry >> Sep 21, 2019  2:27 PM Richard Wolfe, [place] wrote: Reason for CRM: Pt called in stating he would like a text message, email, or call with a doctors name PCP would recommend for surgery on a hernia. Please advise. Advised pt to set up for mychart. Pt declined.   Called and spoke with patient. Patient stated that he needs [name] to recommend a surgeon for hernia repair, name of surgeon. Patient stated that he does not need a referral, just a surgeon's name. Please advise

## 2019-09-24 NOTE — Telephone Encounter (Signed)
Spoke to patient on telephone and provided information.

## 2019-09-28 ENCOUNTER — Ambulatory Visit (INDEPENDENT_AMBULATORY_CARE_PROVIDER_SITE_OTHER): Payer: Medicare Other

## 2019-09-28 ENCOUNTER — Other Ambulatory Visit: Payer: Self-pay

## 2019-09-28 DIAGNOSIS — I351 Nonrheumatic aortic (valve) insufficiency: Secondary | ICD-10-CM | POA: Diagnosis not present

## 2019-09-29 ENCOUNTER — Encounter: Payer: Self-pay | Admitting: Surgery

## 2019-09-29 ENCOUNTER — Other Ambulatory Visit: Payer: Self-pay

## 2019-09-29 ENCOUNTER — Ambulatory Visit (INDEPENDENT_AMBULATORY_CARE_PROVIDER_SITE_OTHER): Payer: Medicare Other | Admitting: Surgery

## 2019-09-29 VITALS — BP 143/81 | HR 85 | Temp 98.1°F | Resp 12 | Ht 71.0 in | Wt 199.8 lb

## 2019-09-29 DIAGNOSIS — K4021 Bilateral inguinal hernia, without obstruction or gangrene, recurrent: Secondary | ICD-10-CM | POA: Diagnosis not present

## 2019-09-29 DIAGNOSIS — Z9889 Other specified postprocedural states: Secondary | ICD-10-CM

## 2019-09-29 DIAGNOSIS — Z8719 Personal history of other diseases of the digestive system: Secondary | ICD-10-CM | POA: Diagnosis not present

## 2019-09-29 NOTE — Patient Instructions (Addendum)
You are scheduled for a CT abdomen and pelvis with contrast:   -Appointment: Monday October 11, 2019 at 2:30pm. Arrival 2:00pm Nothing by mouth after 10:30am that morning. Address[facility] [address], [place], [place] [address]  Please pick up your Oral Contrast prior to appointment [facility] the [facility].  Please see your follow up appointment below.  Call the office if you have any questions or concerns.   Dr. Tinnie Gens Poggi and Dr. Kennedy Bucker [facility] [address] [place], [place] [address] [phone]  Inguinal Hernia, Adult An inguinal hernia develops when fat or the intestines push through a weak spot in a muscle where your leg meets your lower abdomen (groin). This creates a bulge. This kind of hernia could also be:  In your scrotum, if you are male.  In folds of skin around your vagina, if you are male. There are three types of inguinal hernias:  Hernias that can be pushed back into the abdomen (are reducible). This type rarely causes pain.  Hernias that are not reducible (are incarcerated).  Hernias that are not reducible and lose their blood supply (are strangulated). This type of hernia requires emergency surgery. What are the causes? This condition is caused by having a weak spot in the muscles or tissues in the groin. This weak spot develops over time. The hernia may poke through the weak spot when you suddenly strain your lower abdominal muscles, such as when you:  Lift a heavy object.  Strain to have a bowel movement. Constipation can lead to straining.  Cough. What increases the risk? This condition is more likely to develop in:  Men.  Pregnant women.  People who: ? Are overweight. ? Work in jobs that require long periods of standing or heavy lifting. ? Have had an inguinal hernia before. ? Smoke or have lung disease. These factors can lead to long-lasting (chronic) coughing. What are the  signs or symptoms? Symptoms may depend on the size of the hernia. Often, a small inguinal hernia has no symptoms. Symptoms of a larger hernia may include:  A lump in the groin area. This is easier to see when standing. It might not be visible when lying down.  Pain or burning in the groin. This may get worse when lifting, straining, or coughing.  A dull ache or a feeling of pressure in the groin.  In men, an unusual lump in the scrotum. Symptoms of a strangulated inguinal hernia may include:  A bulge in your groin that is very painful and tender to the touch.  A bulge that turns red or purple.  Fever, nausea, and vomiting.  Inability to have a bowel movement or to pass gas. How is this diagnosed? This condition is diagnosed based on your symptoms, your medical history, and a physical exam. Your health care provider may feel your groin area and ask you to cough. How is this treated? Treatment depends on the size of your hernia and whether you have symptoms. If you do not have symptoms, your health care provider may have you watch your hernia carefully and have you come in for follow-up visits. If your hernia is large or if you have symptoms, you may need surgery to repair the hernia. Follow these instructions at home: Lifestyle  Avoid lifting heavy objects.  Avoid standing for long periods of time.  Do not use any products that contain nicotine or tobacco, such as cigarettes and e-cigarettes. If you need help quitting, ask  your health care provider.  Maintain a healthy weight. Preventing constipation  Take actions to prevent constipation. Constipation leads to straining with bowel movements, which can make a hernia worse or cause a hernia repair to break down. Your health care provider may recommend that you: ? Drink enough fluid to keep your urine pale yellow. ? Eat foods that are high in fiber, such as fresh fruits and vegetables, whole grains, and beans. ? Limit foods that are  high in fat and processed sugars, such as fried or sweet foods. ? Take an over-the-counter or prescription medicine for constipation. General instructions  You may try to push the hernia back in place by very gently pressing on it while lying down. Do not try to force the bulge back in if it will not push in easily.  Watch your hernia for any changes in shape, size, or color. Get help right away if you notice any changes.  Take over-the-counter and prescription medicines only as told by your health care provider.  Keep all follow-up visits as told by your health care provider. This is important. Contact a health care provider if:  You have a fever.  You develop new symptoms.  Your symptoms get worse. Get help right away if:  You have pain in your groin that suddenly gets worse.  You have a bulge in your groin that: ? Suddenly gets bigger and does not get smaller. ? Becomes red or purple or painful to the touch.  You are a man and you have a sudden pain in your scrotum, or the size of your scrotum suddenly changes.  You cannot push the hernia back in place by very gently pressing on it when you are lying down. Do not try to force the bulge back in if it will not push in easily.  You have nausea or vomiting that does not go away.  You have a fast heartbeat.  You cannot have a bowel movement or pass gas. These symptoms may represent a serious problem that is an emergency. Do not wait to see if the symptoms will go away. Get medical help right away. Call your local emergency services (911 in the [place] Summary  An inguinal hernia develops when fat or the intestines push through a weak spot in a muscle where your leg meets your lower abdomen (groin).  This condition is caused by having a weak spot in muscles or tissue in your groin.  Symptoms may depend on the size of the hernia, and they may include pain or swelling in your groin. A small inguinal hernia often has no  symptoms.  Treatment may not be needed if you do not have symptoms. If you have symptoms or a large hernia, you may need surgery to repair the hernia.  Avoid lifting heavy objects. Also avoid standing for long amounts of time. This information is not intended to replace advice given to you by your health care provider. Make sure you discuss any questions you have with your health care provider. Document Revised: 09/13/2017 Document Reviewed: 05/14/2017 Elsevier Patient Education  [employer].

## 2019-09-30 ENCOUNTER — Telehealth: Payer: Self-pay | Admitting: *Deleted

## 2019-09-30 DIAGNOSIS — I712 Thoracic aortic aneurysm, without rupture, unspecified: Secondary | ICD-10-CM

## 2019-09-30 NOTE — Telephone Encounter (Signed)
No answer. Left message to call back.   

## 2019-09-30 NOTE — Telephone Encounter (Signed)
-----   Message from Yvonne Kendall, MD sent at 09/30/2019 11:23 AM EST ----- Please let Richard Wolfe know that his echo shows that his heart is contracting well.  There is moderate leakage of the aortic valve and mild to moderate leakage of the mitral valve.  His aorta was also noted to be at least mildly enlarged, consistent with a thoracic aortic aneurysm.  I recommend that we obtain a CTA of the chest to better assess his thoracic aorta.  I see that he is scheduled for a CT of the abdomen and pelvis on 10/11/19 ordered by Dr. Everlene Farrier; it would be ideal if CTA of the chest could be performed at the same time.

## 2019-10-01 ENCOUNTER — Encounter: Payer: Self-pay | Admitting: Surgery

## 2019-10-01 ENCOUNTER — Ambulatory Visit (INDEPENDENT_AMBULATORY_CARE_PROVIDER_SITE_OTHER): Payer: Medicare Other | Admitting: Nurse Practitioner

## 2019-10-01 ENCOUNTER — Encounter: Payer: Self-pay | Admitting: Nurse Practitioner

## 2019-10-01 ENCOUNTER — Other Ambulatory Visit: Payer: Self-pay

## 2019-10-01 VITALS — BP 147/75 | HR 80 | Temp 98.2°F

## 2019-10-01 DIAGNOSIS — M170 Bilateral primary osteoarthritis of knee: Secondary | ICD-10-CM

## 2019-10-01 DIAGNOSIS — J339 Nasal polyp, unspecified: Secondary | ICD-10-CM

## 2019-10-01 DIAGNOSIS — R030 Elevated blood-pressure reading, without diagnosis of hypertension: Secondary | ICD-10-CM

## 2019-10-01 DIAGNOSIS — Z8719 Personal history of other diseases of the digestive system: Secondary | ICD-10-CM

## 2019-10-01 DIAGNOSIS — Z9889 Other specified postprocedural states: Secondary | ICD-10-CM

## 2019-10-01 DIAGNOSIS — J4520 Mild intermittent asthma, uncomplicated: Secondary | ICD-10-CM

## 2019-10-01 DIAGNOSIS — J310 Chronic rhinitis: Secondary | ICD-10-CM

## 2019-10-01 DIAGNOSIS — I351 Nonrheumatic aortic (valve) insufficiency: Secondary | ICD-10-CM | POA: Diagnosis not present

## 2019-10-01 NOTE — Progress Notes (Signed)
BP (!) 147/75 (BP Location: Left Arm, Cuff Size: Normal)   Pulse 80   Temp 98.2 F (36.8 C) (Oral)   SpO2 96%    Subjective:    Patient ID: Richard Wolfe, male    DOB: Jul 17, 1951, 69 y.o.   MRN: [id number]  HPI: Richard Wolfe is a 69 y.o. male  Chief Complaint  Patient presents with  . Follow-up    pt states he is not here for a physical, wants to discuss referrals    AORTIC INSUFFICIENCY: Saw Dr. Okey Dupre with cardiology on 09/01/2019.  Was followed by cardiology in [place] and told to continue this.  On [place] notes it reports moderate to severe aortic insufficiency, "echo in 2018 showed 65% EF with mild to moderate concentric left ventricular hypertrophy, borderline left atrial enlargement, mild to moderate mitral regurgitation and moderate tricuspid regurgitation".  Did have repeat echo recently with cardiology recently, moderate leakage of the aortic valve and mild to moderate leakage of the mitral valve -- aorta was noted to be at least mildly enlarged, consistent with thoracic aortic aneurysm -- recommended to have CTA same day as CT abdomen.  Discussed with patient and he would like this done.  Reports having white coat syndrome.  BP at home 120-130/70's, when has been checked in past, but BP cuff packed and not checking at this time. Aspirin: no Recurrent headaches: no Visual changes: no Palpitations: no Dyspnea: no Chest pain: no Lower extremity edema: no Dizzy/lightheaded: no  ASTHMA, MILD, INTERMITTENT Is scheduled to see pulmonary locally 10/21/2019.  Was followed by pulmonary in [place] for asthma.  Has had this since childhood.  Never had any asthma related issues until 4-5 years ago, is a runner and felt like he was not getting enough oxygen and then was sent to pulmonary.  Had PFTs once a year and was told test results showed he had moderate blockage.  Uses Symbicort inhaler daily. Has Albuterol inhaler, but rarely uses.  States he fills it once a year, but  only uses once or twice a year.   Does not use CPAP, has not used in 1 1/2 to 2 years.  Is aware he would benefit from daily use of this.  He plans to restart use once unpacked from boxes, recently moved. Asthma status: stable Satisfied with current treatment?: yes Albuterol/rescue inhaler frequency: once or twice a year Dyspnea frequency: none Wheezing frequency: none Cough frequency: none Nocturnal symptom frequency: none Limitation of activity: no Current upper respiratory symptoms: no Triggers: exercise Home peak flows:none Last Spirometry:  Failed/intolerant to following asthma meds:  Asthma meds in past:  Aerochamber/spacer use: no Visits to ER or Urgent Care in past year: no Pneumovax: Up to Date Influenza: Up to Date   NASAL POLYPS & CHRONIC RHINITIS: He did see ENT 08/16/2019, had him go through allergy testing -- "according to report is allergic to everything under the sun".  Recommended allergy shots, but went through them for 8 years and does not wish to start again at this time.  Had to have nasal polyps removed every 18 months years ago, now uses EchoStar and Pulmicort neb, which his previous ENT started, and has decreased the amount of polyps he has.  Has not had nasal polyp surgery since 2000.  Was followed by ENT in [place].    INGUINAL AND UMBILICAL HERNIA: Saw Dr. Everlene Farrier with general surgery today.  No surgery at this time, plan is to perform CT scan.  Had triple hernia surgery June 19, 2018, inguinal and umbilical hernias.  Since that time has been gradually feeling pressure when he sits down, swelling, pulling on left side, and feeling like someone stuffed rags under skin.  L>R, left with tenderness at times.  Does endorse lots of lifting due to moving over past months. Does wear supportive underwear daily.    OSTEOARTHRITIS BOTH KNEES: Has referral to ortho per Dr. Everlene Farrier guidance.  Per previous PCP notes this has been slowly progressing and impacts patient's  abilities to perform daily ADLs.  He was followed by Dr. Lars Pinks in [place] and per patient his PCP recommended to forgo injections and instead have bilateral knee replacement.  At this time uses intermittent Tylenol for discomfort and remains active to avoid complications.  Endorses being a very active person, bowling, running, and working outside.  Requested on visit today to have his initial Medicare physical, but wants this performed by the physician in office.  Reports that in [place] and NP came in room with physician and charted for them, he prefers a physician for physical exams because "they went to medical school for many years, a PA and NP did not".  Reiterated role of NP, which was discussed at initial visit, and discussed role of NP in [place] and differences from [place].  He continues to report that he wishes his physical performed by physician and when "people ask who my PCP is, this should be a physician".  Relevant past medical, surgical, family and social history reviewed and updated as indicated. Interim medical history since our last visit reviewed. Allergies and medications reviewed and updated.  Review of Systems  Constitutional: Negative for activity change, diaphoresis, fatigue and fever.  Respiratory: Negative for cough, chest tightness, shortness of breath and wheezing.   Cardiovascular: Negative for chest pain, palpitations and leg swelling.  Gastrointestinal: Negative.   Endocrine: Negative for cold intolerance and heat intolerance.  Neurological: Negative.   Psychiatric/Behavioral: Negative.     Per HPI unless specifically indicated above     Objective:    BP (!) 147/75 (BP Location: Left Arm, Cuff Size: Normal)   Pulse 80   Temp 98.2 F (36.8 C) (Oral)   SpO2 96%   Wt Readings from Last 3 Encounters:  09/29/19 199 lb 12.8 oz (90.6 kg)  09/01/19 198 lb 8 oz (90 kg)  08/09/19 196 lb 6.4 oz (89.1 kg)    Physical Exam Vitals and nursing note reviewed.    Constitutional:      General: He is awake. He is not in acute distress.    Appearance: He is well-developed. He is not ill-appearing.  HENT:     Head: Normocephalic and atraumatic.     Right Ear: Hearing normal. No drainage.     Left Ear: Hearing normal. No drainage.  Eyes:     General: Lids are normal.        Right eye: No discharge.        Left eye: No discharge.     Conjunctiva/sclera: Conjunctivae normal.     Pupils: Pupils are equal, round, and reactive to light.  Neck:     Thyroid: No thyromegaly.     Vascular: No carotid bruit.  Cardiovascular:     Rate and Rhythm: Normal rate and regular rhythm.     Heart sounds: S1 normal and S2 normal. Murmur present. Systolic murmur present with a grade of 1/6. No gallop.   Pulmonary:     Effort: Pulmonary effort is normal. No accessory muscle usage  or respiratory distress.     Breath sounds: Normal breath sounds.     Comments: Slight pectus excavatum noted. Abdominal:     General: Bowel sounds are normal.     Palpations: Abdomen is soft.  Musculoskeletal:        General: Normal range of motion.     Cervical back: Normal range of motion and neck supple.     Right lower leg: No edema.     Left lower leg: No edema.  Lymphadenopathy:     Head:     Right side of head: No submental, submandibular, tonsillar, preauricular or posterior auricular adenopathy.     Left side of head: No submental, submandibular, tonsillar, preauricular or posterior auricular adenopathy.  Skin:    General: Skin is warm and dry.  Neurological:     Mental Status: He is alert and oriented to person, place, and time.  Psychiatric:        Attention and Perception: Attention normal.        Mood and Affect: Mood normal.        Speech: Speech normal.        Behavior: Behavior normal. Behavior is cooperative.        Thought Content: Thought content normal.        Judgment: Judgment normal.     Results for orders placed or performed in visit on 08/09/19   Comprehensive metabolic panel  Result Value Ref Range   Glucose 88 65 - 99 mg/dL   BUN 23 8 - 27 mg/dL   Creatinine, Ser 1.00 0.76 - 1.27 mg/dL   GFR calc non Af Amer 76 >59 mL/min/1.73   GFR calc Af Amer 88 >59 mL/min/1.73   BUN/Creatinine Ratio 23 10 - 24   Sodium 140 134 - 144 mmol/L   Potassium 4.2 3.5 - 5.2 mmol/L   Chloride 101 96 - 106 mmol/L   CO2 24 20 - 29 mmol/L   Calcium 9.6 8.6 - 10.2 mg/dL   Total Protein 7.6 6.0 - 8.5 g/dL   Albumin 4.3 3.8 - 4.8 g/dL   Globulin, Total 3.3 1.5 - 4.5 g/dL   Albumin/Globulin Ratio 1.3 1.2 - 2.2   Bilirubin Total 1.5 (H) 0.0 - 1.2 mg/dL   Alkaline Phosphatase 68 39 - 117 IU/L   AST 35 0 - 40 IU/L   ALT 34 0 - 44 IU/L  Lipid Panel w/o Chol/HDL Ratio out  Result Value Ref Range   Cholesterol, Total 161 100 - 199 mg/dL   Triglycerides 80 0 - 149 mg/dL   HDL 50 >39 mg/dL   VLDL Cholesterol Cal 15 5 - 40 mg/dL   LDL Chol Calc (NIH) 96 0 - 99 mg/dL  Vitamin B12  Result Value Ref Range   Vitamin B-12 >2000 (H) 232 - 1245 pg/mL  Vit D  25 hydroxy (rtn osteoporosis monitoring)  Result Value Ref Range   Vit D, 25-Hydroxy 40.2 30.0 - 100.0 ng/mL  CBC with Differential/Platelet out  Result Value Ref Range   WBC 6.7 3.4 - 10.8 x10E3/uL   RBC 5.09 4.14 - 5.80 x10E6/uL   Hemoglobin 16.0 13.0 - 17.7 g/dL   Hematocrit 48.0 37.5 - 51.0 %   MCV 94 79 - 97 fL   MCH 31.4 26.6 - 33.0 pg   MCHC 33.3 31.5 - 35.7 g/dL   RDW 13.2 11.6 - 15.4 %   Platelets 202 150 - 450 x10E3/uL   Neutrophils 74 Not Estab. %   Lymphs  18 Not Estab. %   Monocytes 7 Not Estab. %   Eos 1 Not Estab. %   Basos 0 Not Estab. %   Neutrophils Absolute 5.0 1.4 - 7.0 x10E3/uL   Lymphocytes Absolute 1.2 0.7 - 3.1 x10E3/uL   Monocytes Absolute 0.4 0.1 - 0.9 x10E3/uL   EOS (ABSOLUTE) 0.1 0.0 - 0.4 x10E3/uL   Basophils Absolute 0.0 0.0 - 0.2 x10E3/uL   Immature Granulocytes 0 Not Estab. %   Immature Grans (Abs) 0.0 0.0 - 0.1 x10E3/uL      Assessment & Plan:   Problem  List Items Addressed This Visit      Cardiovascular and Mediastinum   White coat syndrome without diagnosis of hypertension - Primary    Ongoing with elevation in BP today.  Recommend checking BP daily at home in morning and evening.  No current medications, but will consider if continued elevations.  Continue collaboration with cardiology.      Aortic insufficiency    Continue collaboration with cardiology, alerted Dr. Okey Dupre via secure chat that patient would like to have CTA performed same day as his abdominal CT.        Respiratory   Nasal polyps    No current polyps, continue current medication regimen and collaboration with ENT.      Chronic rhinitis    Chronic, ongoing.  Continue current medication regimen and adjust as needed.  Continue collaboration with ENT.      Mild intermittent asthma, uncomplicated    Chronic, ongoing.  Continue current medication regimen and adjust as needed.  Will review pulmonary note after his upcoming visit.        Musculoskeletal and Integument   Osteoarthritis of both knees    Chronic, ongoing.  He wishes to research orthopedic providers prior to referral being placed.  Continue Tylenol as needed and remain active.        Other   History of inguinal hernia repair    Continue collaboration with general surgery, has upcoming abdominal CT.  He was concerned about contrast, as previous providers did not use this, educated him on use of contrast.         This note is not being shared with the patient for the following reason: To prevent harm (release of this note would result in harm to the life or physical safety of the patient or another).   Follow up plan: Return in about 3 months (around 12/29/2019) for Initial Medicare physical with Dr. Laural Benes.

## 2019-10-01 NOTE — Progress Notes (Signed)
Surgical Consultation  10/01/2019  Richard Wolfe is an 69 y.o. male.   Chief Complaint  Patient presents with  . New Patient (Initial Visit)    Bilateral Inguinal Hernia      HPI:  Richard Wolfe is a 69 year old male seen in consultation for about a 43-month history of bilateral inguinal pain.  The pain is sharp and moderate in nature.  It is intermittent and worsens with certain movements.  Pain also radiates to the scrotum on both sides.  The left side seems to be worse than the right.  Of note the patient had robotic bilateral inguinal hernia repair in [place] about a year and a half ago.  I do not have any operative records at this time. He reports that he had an uneventful repair.  Now his symptoms are very similar as compared to the first time he had his hernias.  He is able to perform more than 4 METS of activity without any shortness of breath or chest pain.  He does see a cardiology for some questionable aortic insufficiency.  His recent labs including a CBC and a CMP were completely normal except very minimal elevation of the total bilirubin of 1.5.  He recently moved to [place] from [place]. He also reports that when he moved he feels that he might have overdone it and may have associated the inguinal pain after lifting heavy boxes. He denies any fevers any chills, no chest pain.  He does report bilateral knee pain and he states that he is in need for replacement of his knees. He just had a recent echocardiogram yesterday and the results are pending at this time. Of note he does have history of undescended testicle on the left side and a prosthesis was placed in the left.   Past Medical History:  Diagnosis Date  . Aortic regurgitation   . Hernia, inguinal, recurrent   . Nasal polyps     Past Surgical History:  Procedure Laterality Date  . CATARACT EXTRACTION W/ INTRAOCULAR LENS IMPLANT Left 10/19/2008  . CATARACT EXTRACTION W/ INTRAOCULAR LENS IMPLANT Right 11/09/2008  .  INGUINAL HERNIA REPAIR Left    1999  . INGUINAL HERNIA REPAIR Right    2019  . [place], [place], [place], 1998, 1999, 2001  . NASAL SEPTUM SURGERY     1981  . REFRACTIVE SURGERY  2008  . trigeminal nerve block  2003  . TRIGEMINAL NERVE DECOMPRESSION  2002, 2003  . [place]  . XI ROBOTIC ASSISTED INGUINAL HERNIA REPAIR WITH MESH Left 2019    Family History  Problem Relation Age of Onset  . Rheum arthritis Mother   . Cancer Father        liver  . Arthritis Brother   . Cancer Paternal Grandfather        throat  . Cancer Brother        liver  . Heart disease Half-Brother        Pacemaker/ICD  . Heart failure Half-Brother     Social History:  reports that he has never smoked. He has never used smokeless tobacco. He reports current alcohol use of about 2.0 standard drinks of alcohol per week. He reports that he does not use drugs.  Allergies:  Allergies  Allergen Reactions  . Penicillins   . Sulfur     Medications reviewed.     ROS Full ROS performed and is otherwise negative other than  what is stated in the HPI    BP (!) 143/81   Pulse 85   Temp 98.1 F (36.7 C) (Temporal)   Resp 12   Ht 5\' 11"  (1.803 m)   Wt 199 lb 12.8 oz (90.6 kg)   SpO2 94%   BMI 27.87 kg/m   Physical Exam Vitals and nursing note reviewed. Exam conducted with a chaperone present.  Constitutional:      General: He is not in acute distress.    Appearance: Normal appearance. He is normal weight.  Eyes:     General: No scleral icterus.       Right eye: No discharge.        Left eye: No discharge.  Cardiovascular:     Rate and Rhythm: Normal rate and regular rhythm.     Heart sounds: No friction rub. No gallop.   Pulmonary:     Effort: Pulmonary effort is normal. No respiratory distress.     Breath sounds: Normal breath sounds. No stridor. No wheezing or rhonchi.  Abdominal:     General: Abdomen is flat. There is no  distension.     Palpations: Abdomen is soft. There is no mass.     Tenderness: There is no abdominal tenderness. There is no guarding or rebound.     Comments: There is tenderness to palpation on bilateral groins, left greater than right. There seems to be a hernia recurrence on the right Inguinal area but due to the discomfort that the exam provokes this finding is equivocal  Genitourinary:    Penis: Normal.      Comments: Evidence of testicular tenderness, Left prosthesis on the Left. No masses Musculoskeletal:        General: No swelling or tenderness. Normal range of motion.     Cervical back: Normal range of motion and neck supple. No rigidity.  Skin:    General: Skin is warm and dry.     Capillary Refill: Capillary refill takes less than 2 seconds.     Coloration: Skin is not jaundiced.  Neurological:     General: No focal deficit present.     Mental Status: He is alert and oriented to person, place, and time. Mental status is at baseline.  Psychiatric:        Mood and Affect: Mood normal.        Behavior: Behavior normal.        Thought Content: Thought content normal.        Judgment: Judgment normal.      Assessment/Plan:  69 year old male with bilateral inguinal pain and questionable right inguinal hernia recurrence.  Due to the tenderness on physical examination it is impossible for me to definitively state that there is a hernia defect on the right side.  Therefore I will obtain a CT scan of the abdomen and pelvis to assess for the abdominal wall anatomy.  We had a lengthy discussion regarding his disease process.  The potential implications of a recurrent inguinal hernia which in that case I would recommend repair.  We also discussed about potential of this being ilioinguinal nerve neuralgia and how to treat this.  We will start treating him with NSAIDs, ice packs and some rest.  There is no need for immediate surgical intervention at this time. I have provided extensive  counseling regarding his bilateral inguinal pain.  He also states that he needs a knee replacement and I have referred him to Dr 66 for assessment of bilateral knee pain. We  will request operative reports as well.  Please note that I spent 60 minutes in this encounter with greater than 50% spent in coordination and counseling of his care.   Sterling Big, MD [facility] General Surgeon

## 2019-10-01 NOTE — Assessment & Plan Note (Signed)
Ongoing with elevation in BP today.  Recommend checking BP daily at home in morning and evening.  No current medications, but will consider if continued elevations.  Continue collaboration with cardiology.

## 2019-10-01 NOTE — Assessment & Plan Note (Signed)
Chronic, ongoing.  Continue current medication regimen and adjust as needed.  Will review pulmonary note after his upcoming visit.

## 2019-10-01 NOTE — Assessment & Plan Note (Signed)
Continue collaboration with general surgery, has upcoming abdominal CT.  He was concerned about contrast, as previous providers did not use this, educated him on use of contrast.

## 2019-10-01 NOTE — Patient Instructions (Signed)
DASH Eating Plan DASH stands for "Dietary Approaches to Stop Hypertension." The DASH eating plan is a healthy eating plan that has been shown to reduce high blood pressure (hypertension). It may also reduce your risk for type 2 diabetes, heart disease, and stroke. The DASH eating plan may also help with weight loss. What are tips for following this plan?  General guidelines  Avoid eating more than 2,300 mg (milligrams) of salt (sodium) a day. If you have hypertension, you may need to reduce your sodium intake to 1,500 mg a day.  Limit alcohol intake to no more than 1 drink a day for nonpregnant women and 2 drinks a day for men. One drink equals 12 oz of beer, 5 oz of wine, or 1 oz of hard liquor.  Work with your health care provider to maintain a healthy body weight or to lose weight. Ask what an ideal weight is for you.  Get at least 30 minutes of exercise that causes your heart to beat faster (aerobic exercise) most days of the week. Activities may include walking, swimming, or biking.  Work with your health care provider or diet and nutrition specialist (dietitian) to adjust your eating plan to your individual calorie needs. Reading food labels   Check food labels for the amount of sodium per serving. Choose foods with less than 5 percent of the Daily Value of sodium. Generally, foods with less than 300 mg of sodium per serving fit into this eating plan.  To find whole grains, look for the word "whole" as the first word in the ingredient list. Shopping  Buy products labeled as "low-sodium" or "no salt added."  Buy fresh foods. Avoid canned foods and premade or frozen meals. Cooking  Avoid adding salt when cooking. Use salt-free seasonings or herbs instead of table salt or sea salt. Check with your health care provider or pharmacist before using salt substitutes.  Do not fry foods. Cook foods using healthy methods such as baking, boiling, grilling, and broiling instead.  Cook with  heart-healthy oils, such as olive, canola, soybean, or sunflower oil. Meal planning  Eat a balanced diet that includes: ? 5 or more servings of fruits and vegetables each day. At each meal, try to fill half of your plate with fruits and vegetables. ? Up to 6-8 servings of whole grains each day. ? Less than 6 oz of lean meat, poultry, or fish each day. A 3-oz serving of meat is about the same size as a deck of cards. One egg equals 1 oz. ? 2 servings of low-fat dairy each day. ? A serving of nuts, seeds, or beans 5 times each week. ? Heart-healthy fats. Healthy fats called Omega-3 fatty acids are found in foods such as flaxseeds and coldwater fish, like sardines, salmon, and mackerel.  Limit how much you eat of the following: ? Canned or prepackaged foods. ? Food that is high in trans fat, such as fried foods. ? Food that is high in saturated fat, such as fatty meat. ? Sweets, desserts, sugary drinks, and other foods with added sugar. ? Full-fat dairy products.  Do not salt foods before eating.  Try to eat at least 2 vegetarian meals each week.  Eat more home-cooked food and less restaurant, buffet, and fast food.  When eating at a restaurant, ask that your food be prepared with less salt or no salt, if possible. What foods are recommended? The items listed may not be a complete list. Talk with your dietitian about   what dietary choices are best for you. Grains Whole-grain or whole-wheat bread. Whole-grain or whole-wheat pasta. Brown rice. Oatmeal. Quinoa. Bulgur. Whole-grain and low-sodium cereals. Pita bread. Low-fat, low-sodium crackers. Whole-wheat flour tortillas. Vegetables Fresh or frozen vegetables (raw, steamed, roasted, or grilled). Low-sodium or reduced-sodium tomato and vegetable juice. Low-sodium or reduced-sodium tomato sauce and tomato paste. Low-sodium or reduced-sodium canned vegetables. Fruits All fresh, dried, or frozen fruit. Canned fruit in natural juice (without  added sugar). Meat and other protein foods Skinless chicken or turkey. Ground chicken or turkey. Pork with fat trimmed off. Fish and seafood. Egg whites. Dried beans, peas, or lentils. Unsalted nuts, nut butters, and seeds. Unsalted canned beans. Lean cuts of beef with fat trimmed off. Low-sodium, lean deli meat. Dairy Low-fat (1%) or fat-free (skim) milk. Fat-free, low-fat, or reduced-fat cheeses. Nonfat, low-sodium ricotta or cottage cheese. Low-fat or nonfat yogurt. Low-fat, low-sodium cheese. Fats and oils Soft margarine without trans fats. Vegetable oil. Low-fat, reduced-fat, or light mayonnaise and salad dressings (reduced-sodium). Canola, safflower, olive, soybean, and sunflower oils. Avocado. Seasoning and other foods Herbs. Spices. Seasoning mixes without salt. Unsalted popcorn and pretzels. Fat-free sweets. What foods are not recommended? The items listed may not be a complete list. Talk with your dietitian about what dietary choices are best for you. Grains Baked goods made with fat, such as croissants, muffins, or some breads. Dry pasta or rice meal packs. Vegetables Creamed or fried vegetables. Vegetables in a cheese sauce. Regular canned vegetables (not low-sodium or reduced-sodium). Regular canned tomato sauce and paste (not low-sodium or reduced-sodium). Regular tomato and vegetable juice (not low-sodium or reduced-sodium). Pickles. Olives. Fruits Canned fruit in a light or heavy syrup. Fried fruit. Fruit in cream or butter sauce. Meat and other protein foods Fatty cuts of meat. Ribs. Fried meat. Bacon. Sausage. Bologna and other processed lunch meats. Salami. Fatback. Hotdogs. Bratwurst. Salted nuts and seeds. Canned beans with added salt. Canned or smoked fish. Whole eggs or egg yolks. Chicken or turkey with skin. Dairy Whole or 2% milk, cream, and half-and-half. Whole or full-fat cream cheese. Whole-fat or sweetened yogurt. Full-fat cheese. Nondairy creamers. Whipped toppings.  Processed cheese and cheese spreads. Fats and oils Butter. Stick margarine. Lard. Shortening. Ghee. Bacon fat. Tropical oils, such as coconut, palm kernel, or palm oil. Seasoning and other foods Salted popcorn and pretzels. Onion salt, garlic salt, seasoned salt, table salt, and sea salt. Worcestershire sauce. Tartar sauce. Barbecue sauce. Teriyaki sauce. Soy sauce, including reduced-sodium. Steak sauce. Canned and packaged gravies. Fish sauce. Oyster sauce. Cocktail sauce. Horseradish that you find on the shelf. Ketchup. Mustard. Meat flavorings and tenderizers. Bouillon cubes. Hot sauce and Tabasco sauce. Premade or packaged marinades. Premade or packaged taco seasonings. Relishes. Regular salad dressings. Where to find more information:  National Heart, Lung, and Blood Institute: www.nhlbi.nih.gov  American Heart Association: www.heart.org Summary  The DASH eating plan is a healthy eating plan that has been shown to reduce high blood pressure (hypertension). It may also reduce your risk for type 2 diabetes, heart disease, and stroke.  With the DASH eating plan, you should limit salt (sodium) intake to 2,300 mg a day. If you have hypertension, you may need to reduce your sodium intake to 1,500 mg a day.  When on the DASH eating plan, aim to eat more fresh fruits and vegetables, whole grains, lean proteins, low-fat dairy, and heart-healthy fats.  Work with your health care provider or diet and nutrition specialist (dietitian) to adjust your eating plan to your   individual calorie needs. This information is not intended to replace advice given to you by your health care provider. Make sure you discuss any questions you have with your health care provider. Document Revised: 07/25/2017 Document Reviewed: 08/05/2016 Elsevier Patient Education  2020 Elsevier Inc.  

## 2019-10-01 NOTE — Assessment & Plan Note (Signed)
No current polyps, continue current medication regimen and collaboration with ENT.

## 2019-10-01 NOTE — Assessment & Plan Note (Signed)
Continue collaboration with cardiology, alerted Dr. Okey Dupre via secure chat that patient would like to have CTA performed same day as his abdominal CT.

## 2019-10-01 NOTE — Assessment & Plan Note (Signed)
Chronic, ongoing.  He wishes to research orthopedic providers prior to referral being placed.  Continue Tylenol as needed and remain active.

## 2019-10-01 NOTE — Assessment & Plan Note (Signed)
Chronic, ongoing.  Continue current medication regimen and adjust as needed.  Continue collaboration with ENT. 

## 2019-10-05 NOTE — Telephone Encounter (Signed)
No answer with patient's number. Left message to call back.  Called wife who is listed on DPR. She answered and verbalized understanding of the results and recommendations.  She understands that we will add on CTA of the chest aorta to already scheduled CT if possible.  Called scheduling and she was able to link the CTA of the chest aorta to already scheduled CT.  No change with appointment time.

## 2019-10-05 NOTE — Telephone Encounter (Signed)
Patient calling back. Scheduler said patient does not hear his phone ring and does not get messages. Unable to transfer call to me because the call dropped. Attempted to call patient back and it went straight to his VM. Left message.

## 2019-10-11 ENCOUNTER — Ambulatory Visit
Admission: RE | Admit: 2019-10-11 | Discharge: 2019-10-11 | Disposition: A | Discharge status: Home / Self Care | Payer: Medicare Other | Source: Ambulatory Visit | Attending: Surgery | Admitting: Surgery

## 2019-10-11 ENCOUNTER — Other Ambulatory Visit: Payer: Self-pay

## 2019-10-11 DIAGNOSIS — K4021 Bilateral inguinal hernia, without obstruction or gangrene, recurrent: Secondary | ICD-10-CM

## 2019-10-11 DIAGNOSIS — I712 Thoracic aortic aneurysm, without rupture, unspecified: Secondary | ICD-10-CM

## 2019-10-11 LAB — POCT I-STAT CREATININE: Creatinine, Ser: 1 mg/dL (ref 0.61–1.24)

## 2019-10-11 MED ORDER — IOHEXOL 350 MG/ML SOLN
100.0000 mL | Freq: Once | INTRAVENOUS | Status: AC | PRN
  Administered 2019-10-11: 100 mL via INTRAVENOUS

## 2019-10-13 ENCOUNTER — Encounter: Payer: Self-pay | Admitting: Surgery

## 2019-10-13 ENCOUNTER — Other Ambulatory Visit: Payer: Self-pay

## 2019-10-13 ENCOUNTER — Ambulatory Visit (INDEPENDENT_AMBULATORY_CARE_PROVIDER_SITE_OTHER): Payer: Medicare Other | Admitting: Surgery

## 2019-10-13 ENCOUNTER — Telehealth: Payer: Self-pay | Admitting: *Deleted

## 2019-10-13 VITALS — BP 152/75 | HR 89 | Temp 97.9°F | Resp 12 | Ht 70.0 in | Wt 194.8 lb

## 2019-10-13 DIAGNOSIS — Z9889 Other specified postprocedural states: Secondary | ICD-10-CM

## 2019-10-13 DIAGNOSIS — Z800 Family history of malignant neoplasm of digestive organs: Secondary | ICD-10-CM

## 2019-10-13 DIAGNOSIS — I723 Aneurysm of iliac artery: Secondary | ICD-10-CM

## 2019-10-13 DIAGNOSIS — I712 Thoracic aortic aneurysm, without rupture, unspecified: Secondary | ICD-10-CM

## 2019-10-13 DIAGNOSIS — Z8719 Personal history of other diseases of the digestive system: Secondary | ICD-10-CM | POA: Diagnosis not present

## 2019-10-13 DIAGNOSIS — K4021 Bilateral inguinal hernia, without obstruction or gangrene, recurrent: Secondary | ICD-10-CM

## 2019-10-13 NOTE — Telephone Encounter (Signed)
No answer. Left message to call back.   

## 2019-10-13 NOTE — Patient Instructions (Addendum)
We have placed a referral to see Dr. Gilda Crease at [facility]. They will contact you within 7-10 days to schedule an appointment. If you do not hear from their office please contact us to see what's going on.   We have also placed another referral to see Dr. Tobi Bastos at [facility]. They will contact you within 7-10 days to schedule an appointment. If you do not hear from their office please contact us to see what's going on.   You will not need a follow up appointment with Dr. Everlene Farrier unless you need to return. If you do need to be seen please contact the office.   Please call the office if you have any questions or concerns.

## 2019-10-13 NOTE — Telephone Encounter (Signed)
-----   Message from Yvonne Kendall, MD sent at 10/13/2019  6:56 AM EST ----- Please let Richard Wolfe know that his CTA shows mild enlargement of the thoracic aorta and well as a small iliac artery aneurysm.  I recommend repeat CTA of the chest, abdomen, and pelvis in ~1 year to ensure stability.  No medication changes recommended at this time.

## 2019-10-14 ENCOUNTER — Telehealth: Payer: Self-pay

## 2019-10-14 NOTE — Telephone Encounter (Signed)
No answer. Left detailed message on patient's phone with results and to call back if any questions.  Patient's wife is listed on DPR as well. Called her and gave her the recommendations as well and she verbalized understanding. She will let the patient know and to call us if he has any further questions.

## 2019-10-14 NOTE — Telephone Encounter (Signed)
Contacted patient in regard to scheduling his colonoscopy.  He informed me that he has been having colonoscopies since the age of 80 every 5 years due to family history of colon cancer.  On most of his colonoscopies, he has had some benign colon polyps.  However, he has not had his colonoscopy since 10-15 years because he has had poor bowel prep results-preventing his colonoscopy from being performed.  He has also stated that he would not like to try the same routine bowel prep he had in the past.  I assured him that we have other options available now-such as the SuTab Bowel Prep.  I explained that this bowel prep is 99.4% effective and has just recently come out in February.  Its from the makers of SuPrep. I've asked him to stop by the office one day prior to scheduling his colonoscopy to provide him with an instruction handout and if still interested I will provide him a sample of SuTab. My direct phone number has been given to him.  He also plans on bringing in his medical records.  Thanks, [place], [place]

## 2019-10-18 ENCOUNTER — Encounter: Payer: Self-pay | Admitting: Surgery

## 2019-10-18 NOTE — Progress Notes (Signed)
Outpatient Surgical Follow Up  10/18/2019  Richard Wolfe is an 69 y.o. male.   Chief Complaint  Patient presents with  . Follow-up    Bilaterl Inguinal Hernia- discuss CT A&P    HPI: Richard Wolfe is a 69 year old male well-known to me with a prior history of bilateral laparoscopic inguinal hernia repair in [place] now with recurrent right inguinal pain.  He Wolfe tried ice and nonsteroidal anti-inflammatories.  He reports significant improvement still Wolfe some tenderness to the right inguinal area.  I did order a CT scan and I personally reviewed it as well as show the patient the pictures.  There is no evidence of inguinal hernias however there is some thickening on the right inguinal floor.  There is also a left iliac artery aneurysm.  Past Medical History:  Diagnosis Date  . Aortic regurgitation   . Hernia, inguinal, recurrent   . Nasal polyps     Past Surgical History:  Procedure Laterality Date  . CATARACT EXTRACTION W/ INTRAOCULAR LENS IMPLANT Left 10/19/2008  . CATARACT EXTRACTION W/ INTRAOCULAR LENS IMPLANT Right 11/09/2008  . INGUINAL HERNIA REPAIR Left    1999  . INGUINAL HERNIA REPAIR Right    2019  . NASAL POLYP EXCISION     1981, 1986, 1993, 1994, 1995, 1996, 1998, 1999, 2001  . NASAL SEPTUM SURGERY     1981  . REFRACTIVE SURGERY  2008  . trigeminal nerve block  2003  . TRIGEMINAL NERVE DECOMPRESSION  2002, 2003  . WISDOM TOOTH EXTRACTION  1967  . XI ROBOTIC ASSISTED INGUINAL HERNIA REPAIR WITH MESH Left 2019    Family History  Problem Relation Age of Onset  . Rheum arthritis Mother   . Cancer Father        liver  . Arthritis Brother   . Cancer Paternal Grandfather        throat  . Cancer Brother        liver  . Heart disease Half-Brother        Pacemaker/ICD  . Heart failure Half-Brother     Social History:  reports that he Wolfe never smoked. He Wolfe never used smokeless tobacco. He reports current alcohol use of about 2.0 standard drinks of  alcohol per week. He reports that he does not use drugs.  Allergies:  Allergies  Allergen Reactions  . Penicillins   . Sulfur     Medications reviewed.    ROS Full ROS performed and is otherwise negative other than what is stated in HPI   BP (!) 152/75   Pulse 89   Temp 97.9 F (36.6 C)   Resp 12   Ht 5\' 10"  (1.778 m)   Wt 194 lb 12.8 oz (88.4 kg)   SpO2 96%   BMI 27.95 kg/m   Physical Exam Vitals and nursing note reviewed. Exam conducted with a chaperone present.  Constitutional:      General: He is not in acute distress.    Appearance: Normal appearance. He is normal weight.  Eyes:     General: No scleral icterus.       Right eye: No discharge.        Left eye: No discharge.  Cardiovascular:     Rate and Rhythm: Normal rate and regular rhythm.  Pulmonary:     Effort: Pulmonary effort is normal. No respiratory distress.  Abdominal:     General: Abdomen is flat. There is no distension.     Palpations: There is no mass.  Tenderness: There is no abdominal tenderness. There is no guarding or rebound.     Comments: Mild tenderness palpation her right inguinal area no definitive defects but definitely there is overall weakness of the inguinal floor.  Musculoskeletal:        General: Normal range of motion.     Cervical back: Normal range of motion and neck supple. No rigidity or tenderness.  Skin:    General: Skin is warm.  Neurological:     General: No focal deficit present.     Mental Status: He is alert and oriented to person, place, and time.  Psychiatric:        Mood and Affect: Mood normal.        Behavior: Behavior normal.        Thought Content: Thought content normal.        Judgment: Judgment normal.        Assessment/Plan: Right inguinal pain after bilateral inguinal hernia repair.  The patient Wolfe improved with medical treatment.  I did discuss with him about potential of ilioinguinal nerve block and at this time he declines.  Regarding the  left iliac aneurysm is 2.9 cm and will need close follow-up versus endovascular intervention.  We will send a referral to Dr. Delana Meyer our vascular surgeon.  Extensive counseling provided  Greater than 50% of the 40 minutes  visit was spent in counseling/coordination of care   Caroleen Hamman, MD [place] [facility]

## 2019-10-21 ENCOUNTER — Telehealth: Payer: Self-pay

## 2019-10-21 ENCOUNTER — Ambulatory Visit (INDEPENDENT_AMBULATORY_CARE_PROVIDER_SITE_OTHER): Payer: Medicare Other | Admitting: Pulmonary Disease

## 2019-10-21 ENCOUNTER — Other Ambulatory Visit: Payer: Self-pay

## 2019-10-21 ENCOUNTER — Encounter: Payer: Self-pay | Admitting: Pulmonary Disease

## 2019-10-21 VITALS — BP 140/82 | HR 82 | Temp 98.2°F | Ht 71.0 in | Wt 196.2 lb

## 2019-10-21 DIAGNOSIS — G4733 Obstructive sleep apnea (adult) (pediatric): Secondary | ICD-10-CM | POA: Diagnosis not present

## 2019-10-21 DIAGNOSIS — Z1211 Encounter for screening for malignant neoplasm of colon: Secondary | ICD-10-CM

## 2019-10-21 DIAGNOSIS — J4530 Mild persistent asthma, uncomplicated: Secondary | ICD-10-CM | POA: Diagnosis not present

## 2019-10-21 NOTE — Telephone Encounter (Addendum)
Created in error

## 2019-10-21 NOTE — Patient Instructions (Signed)
Can change symbicort to two puffs as needed for cough, wheeze, chest congestion, or shortness of breath  Will arrange to get copy of your sleep studies, pulmonary function tests, and chest imaging studies from [place]  Follow up in 1 year

## 2019-10-21 NOTE — Telephone Encounter (Signed)
Gastroenterology Pre-Procedure Review  Request Date: April 9th, 2021 Requesting Physician: Dr. Tobi Bastos  PATIENT REVIEW QUESTIONS: The patient responded to the following health history questions as indicated:    1. Are you having any GI issues? no 2. Do you have a personal history of Polyps? yes (they were benign he said many years ago) 3. Do you have a family history of Colon Cancer or Polyps? yes (father colon cancer) 4. Diabetes Mellitus? no 5. Joint replacements in the past 12 months?no 6. Major health problems in the past 3 months?no 7. Any artificial heart valves, MVP, or defibrillator?no    MEDICATIONS & ALLERGIES:    Patient reports the following regarding taking any anticoagulation/antiplatelet therapy:   Plavix, Coumadin, Eliquis, Xarelto, Lovenox, Pradaxa, Brilinta, or Effient? no Aspirin? no  Patient confirms/reports the following medications:  Current Outpatient Medications  Medication Sig Dispense Refill  . Ascorbic Acid (VITAMIN C) 1000 MG tablet Take 1,000 mg by mouth daily.    . budesonide (PULMICORT) 0.5 MG/2ML nebulizer solution Take by nebulization 2 (two) times daily.    . Cholecalciferol (VITAMIN D3) 125 MCG (5000 UT) CAPS Take 1 capsule by mouth daily.    . cyanocobalamin 1000 MCG tablet Take by mouth.    . DHA-EPA-Vitamin E (OMEGA-3 COMPLEX PO) Take 1,280 mg by mouth daily.    Marland Kitchen doxycycline (PERIOSTAT) 20 MG tablet Take 20 mg by mouth 2 (two) times daily.    . Folic Acid (FOLATE PO) Take 400 mcg by mouth daily.    . Magnesium 400 MG CAPS Take 2 capsules by mouth daily.    . milk thistle 175 MG tablet Take 350 mg by mouth daily.    . Misc Natural Products (JOINT HEALTH PO) Take 250 mg by mouth daily.    . Omega-3 Fatty Acids (OMEGA-3 FISH OIL) 1200 MG CAPS Take by mouth.    . SYMBICORT 80-4.5 MCG/ACT inhaler Inhale 1 puff into the lungs 2 (two) times daily.    . TURMERIC PO Take 20,000 Units by mouth daily.     No current facility-administered medications for  this visit.    Patient confirms/reports the following allergies:  Allergies  Allergen Reactions  . Penicillins   . Sulfur     No orders of the defined types were placed in this encounter.   AUTHORIZATION INFORMATION Primary Insurance: 1D#: Group #:  Secondary Insurance: 1D#: Group #:  SCHEDULE INFORMATION: Date: April 9th,  Time: Location:[facility]

## 2019-10-21 NOTE — Telephone Encounter (Signed)
Record release from has been faxed to [facility] and [facility].  Will await records.

## 2019-10-21 NOTE — Progress Notes (Signed)
[facility]  Chief Complaint  Patient presents with  . sleep consult    per [facility], NP--prior sleep study 5 years ago in [facility]. not currently wearing cpap due to recent move.      Constitutional:  BP 140/82 (BP Location: Left Arm, Cuff Size: Normal)   Pulse 82   Temp 98.2 F (36.8 C) (Temporal)   Ht 5\' 11"  (1.803 m)   Wt 196 lb 3.2 oz (89 kg)   SpO2 96%   BMI 27.36 kg/m   Past Medical History:  Valvular heart disease, Ascending thoracic aortic aneurysm, Ashtma, Nasal polyps  Brief Summary:  Richard Wolfe is a 69 y.o. male with asthma and obstructive sleep apnea.  He is a retired Engineer, structural that was living in [place], [place] and moved to [place] to be closer to family.  He used to have asthma as a child that would flare up when he ran track.  He outgrew this, but then symptoms recurred as an adult.  He was followed by pulmonary in [place].  Has been on symbicort.  Also uses nebulized budesonide once per week.  Found to have nasal polyps, and used budesonide nasal solution also.  Not recently having cough, wheeze, sputum, or chest tightness.  Main limitation to activity level is arthritis in his knees.  He was seen by ENT in [place].  Had sleep study and told he has very mild sleep apnea, but should use CPAP.  He has been using CPAP for about 4 years.  No issues with mask fit or pressure setting.  He hasn't been able to use CPAP recently since he is still in the process of unpacking.     Physical Exam:   Appearance - well kempt   ENMT - clear nasal mucosa, midline nasal  septum, no oral exudates, no LAN, trachea midline  Respiratory - normal chest wall, normal respiratory effort, no accessory muscle use, no wheeze/rales  CV - s1s2 regular rate and rhythm, no murmurs, no peripheral edema, radial pulses symmetric  GI - soft, non tender, no masses  Lymph - no adenopathy noted in neck and axillary areas  MSK -  normal gait  Ext - no cyanosis, clubbing, or joint inflammation noted  Skin - no rashes, lesions, or ulcers  Neuro - normal strength, oriented x 3  Psych - normal mood and affect   Assessment/Plan:   Mild, intermittent asthma. - advised that he can try just using symbicort prn for now - advised that he doesn't need to used nebulized budesonide if he is also using symbicort - if he develops hoarseness from symbicort again, then could set up spacer device - will get copy of his PFT and imaging studies from his pulmonologist in [place]  Obstructive sleep apnea. - he reports benefit from therapy - will get copy of his sleep study from [place] - he will notify our office when he needs to get set up with a DME for supplies; he feels he has enough supplies at present  Nasal polyposis. - he is using budesonide nasal solution  Lt common iliac artery aneurysm. - he has appointment with vascular surgery  Ascending thoracic aorta dilation. Valvular heart disease. - reviewed imaging studies with him - he will f/u with cardiology   Patient Instructions  Can change symbicort to two puffs as needed for cough, wheeze, chest congestion, or shortness of breath  Will arrange to get copy of your sleep studies, pulmonary function tests,  and chest imaging studies from [place]  Follow up in 1 year   Time spent 46 minutes   Coralyn Helling, MD [facility] Pager: [phone] 10/21/2019, 12:53 PM  Flow Sheet     Pulmonary tests:    Sleep tests:    Cardiac tests:  Echo 09/28/19 >> EF 55 to 60%, mild/mod MR, mod AR, aortic root 42 mm  Medications:   Allergies as of 10/21/2019      Reactions   Penicillins    Sulfur       Medication List       Accurate as of October 21, 2019 12:53 PM. If you have any questions, ask your nurse or doctor.        budesonide 0.5 MG/2ML nebulizer solution Commonly known as: PULMICORT Take by nebulization 2 (two) times  daily.   cyanocobalamin 1000 MCG tablet Take by mouth.   doxycycline 20 MG tablet Commonly known as: PERIOSTAT Take 20 mg by mouth 2 (two) times daily.   FOLATE PO Take 400 mcg by mouth daily.   JOINT HEALTH PO Take 250 mg by mouth daily.   Magnesium 400 MG Caps Take 2 capsules by mouth daily.   milk thistle 175 MG tablet Take 350 mg by mouth daily.   OMEGA-3 COMPLEX PO Take 1,280 mg by mouth daily.   Omega-3 Fish Oil 1200 MG Caps Take by mouth.   Symbicort 80-4.5 MCG/ACT inhaler Generic drug: budesonide-formoterol Inhale 1 puff into the lungs 2 (two) times daily.   TURMERIC PO Take 20,000 Units by mouth daily.   vitamin C 1000 MG tablet Take 1,000 mg by mouth daily.   Vitamin D3 125 MCG (5000 UT) Caps Take 1 capsule by mouth daily.       Past Surgical History:  He  has a past surgical history that includes Wisdom tooth extraction (1967); Nasal polyp excision; Nasal septum surgery; Inguinal hernia repair (Left); Trigeminal nerve decompression (2002, 2003); trigeminal nerve block (2003); Refractive surgery (2008); Cataract extraction w/ intraocular lens implant (Left, 10/19/2008); Cataract extraction w/ intraocular lens implant (Right, 11/09/2008); Inguinal hernia repair (Right); and XI Robotic assisted inguinal hernia repair with mesh (Left, 2019).  Family History:  His family history includes Arthritis in his brother; Cancer in his brother, father, and paternal grandfather; Heart disease in his half-brother; Heart failure in his half-brother; Rheum arthritis in his mother.  Social History:  He  reports that he has never smoked. He has never used smokeless tobacco. He reports current alcohol use of about 2.0 standard drinks of alcohol per week. He reports that he does not use drugs.

## 2019-10-25 ENCOUNTER — Ambulatory Visit (INDEPENDENT_AMBULATORY_CARE_PROVIDER_SITE_OTHER): Payer: Medicare Other | Admitting: Vascular Surgery

## 2019-10-25 ENCOUNTER — Other Ambulatory Visit: Payer: Self-pay

## 2019-10-25 ENCOUNTER — Encounter (INDEPENDENT_AMBULATORY_CARE_PROVIDER_SITE_OTHER): Payer: Self-pay | Admitting: Vascular Surgery

## 2019-10-25 VITALS — BP 155/76 | HR 89 | Resp 16 | Ht 71.0 in | Wt 195.2 lb

## 2019-10-25 DIAGNOSIS — I723 Aneurysm of iliac artery: Secondary | ICD-10-CM

## 2019-10-25 DIAGNOSIS — M170 Bilateral primary osteoarthritis of knee: Secondary | ICD-10-CM

## 2019-10-25 DIAGNOSIS — J4520 Mild intermittent asthma, uncomplicated: Secondary | ICD-10-CM | POA: Diagnosis not present

## 2019-10-25 DIAGNOSIS — I872 Venous insufficiency (chronic) (peripheral): Secondary | ICD-10-CM

## 2019-10-27 ENCOUNTER — Encounter (INDEPENDENT_AMBULATORY_CARE_PROVIDER_SITE_OTHER): Payer: Self-pay | Admitting: Vascular Surgery

## 2019-10-27 DIAGNOSIS — I723 Aneurysm of iliac artery: Secondary | ICD-10-CM | POA: Insufficient documentation

## 2019-10-27 NOTE — Telephone Encounter (Signed)
Received records from [place] and faxed to the [place] to Dr. Craige Cotta for his review. Nothing further needed.

## 2019-10-27 NOTE — Progress Notes (Signed)
MRN : [id number]  Richard Wolfe is a 69 y.o. (December 23, 1950) male who presents with chief complaint of  Chief Complaint  Patient presents with  . New Patient (Initial Visit)    ref [place] left iliac aneurysm  .  History of Present Illness:   The patient presents to the office for evaluation of an abdominal aortic aneurysm. The aneurysm was found incidentally by CT scan done for abdominal pain. Today the patient denies abdominal pain or unusual back pain, no other abdominal complaints.  No history of an acute onset of painful blue discoloration of the toes.     No family history of AAA.   Patient denies amaurosis fugax or TIA symptoms. There is no history of claudication or rest pain symptoms of the lower extremities.  The patient denies angina or shortness of breath.  CT scan shows an isolated left common iliac artery aneurysm that is 3.1 cm in size the right common iliac is just greater than 2 cm.   Current Meds  Medication Sig  . Ascorbic Acid (VITAMIN C) 1000 MG tablet Take 1,000 mg by mouth daily.  . budesonide (PULMICORT) 0.5 MG/2ML nebulizer solution Take by nebulization 2 (two) times daily.  . Cholecalciferol (VITAMIN D3) 125 MCG (5000 UT) CAPS Take 1 capsule by mouth daily.  . cyanocobalamin 1000 MCG tablet Take by mouth.  . DHA-EPA-Vitamin E (OMEGA-3 COMPLEX PO) Take 1,280 mg by mouth daily.  Marland Kitchen. doxycycline (PERIOSTAT) 20 MG tablet Take 20 mg by mouth 2 (two) times daily.  . Folic Acid (FOLATE PO) Take 400 mcg by mouth daily.  . Magnesium 400 MG CAPS Take 2 capsules by mouth daily.  . milk thistle 175 MG tablet Take 350 mg by mouth daily.  . Misc Natural Products (JOINT HEALTH PO) Take 250 mg by mouth daily.  . Omega-3 Fatty Acids (OMEGA-3 FISH OIL) 1200 MG CAPS Take by mouth.  . SYMBICORT 80-4.5 MCG/ACT inhaler Inhale 1 puff into the lungs 2 (two) times daily.  . TURMERIC PO Take 20,000 Units by mouth daily.    Past Medical History:  Diagnosis Date  . Aortic  regurgitation   . Asthma   . Hernia, inguinal, recurrent   . Nasal polyps     Past Surgical History:  Procedure Laterality Date  . CATARACT EXTRACTION W/ INTRAOCULAR LENS IMPLANT Left 10/19/2008  . CATARACT EXTRACTION W/ INTRAOCULAR LENS IMPLANT Right 11/09/2008  . INGUINAL HERNIA REPAIR Left    1999  . INGUINAL HERNIA REPAIR Right    2019  . NASAL POLYP EXCISION     1981, 1986, 1993, 1994, 1995, 1996, 1998, 1999, 2001  . NASAL SEPTUM SURGERY     1981  . REFRACTIVE SURGERY  2008  . trigeminal nerve block  2003  . TRIGEMINAL NERVE DECOMPRESSION  2002, 2003  . WISDOM TOOTH EXTRACTION  1967  . XI ROBOTIC ASSISTED INGUINAL HERNIA REPAIR WITH MESH Left 2019    Social History Social History   Tobacco Use  . Smoking status: Never Smoker  . Smokeless tobacco: Never Used  Substance Use Topics  . Alcohol use: Yes    Alcohol/week: 2.0 standard drinks    Types: 1 Glasses of wine, 1 Cans of beer per week  . Drug use: Never    Family History Family History  Problem Relation Age of Onset  . Rheum arthritis Mother   . Cancer Father        liver  . Arthritis Brother   . Cancer Paternal Grandfather  throat  . Cancer Brother        liver  . Heart disease Half-Brother        Pacemaker/ICD  . Heart failure Half-Brother   No family history of bleeding/clotting disorders, porphyria or autoimmune disease   Allergies  Allergen Reactions  . Penicillins   . Sulfur      REVIEW OF SYSTEMS (Negative unless checked)  Constitutional: [] Weight loss  [] Fever  [] Chills Cardiac: [] Chest pain   [] Chest pressure   [] Palpitations   [] Shortness of breath when laying flat   [] Shortness of breath with exertion. Vascular:  [] Pain in legs with walking   [] Pain in legs at rest  [] History of DVT   [] Phlebitis   [] Swelling in legs   [] Varicose veins   [] Non-healing ulcers Pulmonary:   [] Uses home oxygen   [] Productive cough   [] Hemoptysis   [] Wheeze  [] COPD   [] Asthma Neurologic:   [] Dizziness   [] Seizures   [] History of stroke   [] History of TIA  [] Aphasia   [] Vissual changes   [] Weakness or numbness in arm   [] Weakness or numbness in leg Musculoskeletal:   [] Joint swelling   [] Joint pain   [] Low back pain Hematologic:  [] Easy bruising  [] Easy bleeding   [] Hypercoagulable state   [] Anemic Gastrointestinal:  [] Diarrhea   [] Vomiting  [] Gastroesophageal reflux/heartburn   [] Difficulty swallowing. Genitourinary:  [] Chronic kidney disease   [] Difficult urination  [] Frequent urination   [] Blood in urine Skin:  [] Rashes   [] Ulcers  Psychological:  [] History of anxiety   []  History of major depression.  Physical Examination  Vitals:   10/25/19 1320  BP: (!) 155/76  Pulse: 89  Resp: 16  Weight: 195 lb 3.2 oz (88.5 kg)  Height: 5\' 11"  (1.803 m)   Body mass index is 27.22 kg/m. Gen: WD/WN, NAD Head: [place]/AT, No temporalis wasting.  Ear/Nose/Throat: Hearing grossly intact, nares w/o erythema or drainage, poor dentition Eyes: PER, EOMI, sclera nonicteric.  Neck: Supple, no masses.  No bruit or JVD.  Pulmonary:  Good air movement, clear to auscultation bilaterally, no use of accessory muscles.  Cardiac: RRR, normal S1, S2, no Murmurs. Vascular: No carotid bruits, popliteal pulses are not enlarged Vessel Right Left  Radial Palpable Palpable  Carotid Palpable Palpable  Popliteal Palpable Palpable  PT Palpable Palpable  DP Palpable Palpable  Gastrointestinal: soft, non-distended. No guarding/no peritoneal signs.  Musculoskeletal: M/S 5/5 throughout.  No deformity or atrophy.  Neurologic: CN 2-12 intact. Pain and light touch intact in extremities.  Symmetrical.  Speech is fluent. Motor exam as listed above. Psychiatric: Judgment intact, Mood & affect appropriate for pt's clinical situation. Dermatologic: No rashes or ulcers noted.  No changes consistent with cellulitis.  CBC Lab Results  Component Value Date   WBC 6.7 08/09/2019   HGB 16.0 08/09/2019   HCT 48.0  08/09/2019   MCV 94 08/09/2019   PLT 202 08/09/2019    BMET    Component Value Date/Time   NA 140 08/09/2019 1425   K 4.2 08/09/2019 1425   CL 101 08/09/2019 1425   CO2 24 08/09/2019 1425   GLUCOSE 88 08/09/2019 1425   BUN 23 08/09/2019 1425   CREATININE 1.00 10/11/2019 1455   CALCIUM 9.6 08/09/2019 1425   GFRNONAA 76 08/09/2019 1425   GFRAA 88 08/09/2019 1425   Estimated Creatinine Clearance: 73.2 mL/min (by C-G formula based on SCr of 1 mg/dL).  COAG No results found for: INR, PROTIME  Radiology CT Abdomen Pelvis W Contrast  Result Date: 10/12/2019 CLINICAL DATA:  Aortic regurgitation. Ascending thoracic aortic prominence on recent echocardiogram abdominal pain with concern for inguinal hernias EXAM: CT ANGIOGRAPHY CHEST CT ABDOMEN AND PELVIS WITH CONTRAST TECHNIQUE: Multidetector CT imaging of the chest was performed using the standard protocol during bolus administration of intravenous contrast. Multiplanar CT image reconstructions and MIPs were obtained to evaluate the vascular anatomy. Multidetector CT imaging of the abdomen and pelvis was performed using the standard protocol during bolus administration of intravenous contrast. Oral contrast also administered. CONTRAST:  100mL OMNIPAQUE IOHEXOL 350 MG/ML SOLN COMPARISON:  Report of echocardiogram September 28, 2019 FINDINGS: CTA CHEST FINDINGS Cardiovascular: Ascending thoracic aortic diameter at the level of the main pulmonary outflow tract measures 3.9 cm. Ascending thoracic aortic diameter at the level of the sinuses of Valsalva measures 4.2 cm. Measured thoracic aortic diameter at the level of the distal aortic arch measures 3.5 cm. Descending thoracic aortic measurement at the main pulmonary outflow level measures 2.8 x 2.8 cm. No evident thoracic aortic dissection. Visualized great vessels appear unremarkable. No evident pulmonary embolus. No pericardial effusion or pericardial thickening. There are scattered foci of coronary  artery calcification. Mediastinum/Nodes: Visualized thyroid appears unremarkable. There is no evident thoracic adenopathy. No esophageal lesions are appreciable. Lungs/Pleura: There are scattered areas of atelectatic change. There is no evident edema or airspace opacity. No pleural effusions are evident. Musculoskeletal: There are foci of degenerative change in the thoracic spine. No blastic or lytic bone lesions are evident. There is degenerative change in each sternoclavicular joint with vacuum phenomenon in these joints bilaterally. Review of the MIP images confirms the above findings. CT ABDOMEN and PELVIS FINDINGS Hepatobiliary: No focal liver lesions are appreciable. There is a laminated gallstone within the gallbladder. There is also sludge in the gallbladder. The gallbladder wall does not appear appreciably thickened. There is no evident biliary duct dilatation. Pancreas: There is no pancreatic mass or inflammatory focus. Spleen: No splenic lesions are evident. Adrenals/Urinary Tract: Adrenals appear within normal limits bilaterally. There are multiple renal cysts bilaterally. There is a dominant cyst arising from the inferior right kidney measuring 11.9 x 10.9 cm. Next largest cyst arises in the upper to mid right kidney measuring 7.1 x 5.0 cm. Largest cyst on the left measures 3.5 x 3.1 cm. There are several parapelvic cysts on the left. There is no evident hydronephrosis on either side. There is no appreciable renal or ureteral calculus on either side. Urinary bladder is midline. Urinary bladder wall is upper normal in thickness. Stomach/Bowel: There is no appreciable bowel wall or mesenteric thickening. There is moderate stool throughout the colon. Terminal ileum appears normal. There is no evident bowel obstruction. There is no free air or portal venous air. Vascular/Lymphatic: Aorta is somewhat tortuous. There is no abdominal aortic aneurysm. There is, however, localized aneurysmal dilatation of the  common iliac artery with aneurysm in this area measuring 2.9 x 2.3 cm. There is aortic and iliac artery atherosclerosis. No adenopathy evident in the abdomen or pelvis. Reproductive: There are prostatic calcifications. Prostate and seminal vesicles are normal in size and configuration. No pelvic masses are evident. There is an apparent prosthesis in the left scrotal sac. There is a hydrocele involving the right scrotum with fluid from the right scrotum tracking into the lower inguinal ring. Other: There is soft tissue opacity in the right inguinal ring. Question previous repair in this area. No bowel containing hernia evident. There is no periappendiceal region inflammatory change. No evident abscess or ascites in  the abdomen or pelvis. Musculoskeletal: There is degenerative change in the lumbar spine. No blastic or lytic bone lesions. No intramuscular lesions are evident. Review of the MIP images confirms the above findings. IMPRESSION: CT angiogram chest: 1. Ascending thoracic aortic diameter at the sinuses of Valsalva is 4.2 cm. Transverse diameter in the distal aortic arch is 3.5 cm. Ascending thoracic aortic diameter in the mid ascending thoracic region measures 3.9 x 3.9 cm. Recommend annual imaging followup by CTA or MRA. This recommendation follows 2010 ACCF/AHA/AATS/ACR/ASA/SCA/SCAI/SIR/STS/SVM Guidelines for the Diagnosis and Management of Patients with Thoracic Aortic Disease. Circulation.2010; 121: F643-P295. Aortic aneurysm NOS (ICD10-I71.9). There are foci of coronary artery calcification. 2. Scattered areas of atelectatic change. No edema or airspace opacity. 3.  No evident adenopathy. 4.  No demonstrable pulmonary embolus. CT abdomen and pelvis: 1.  Cholelithiasis.  No appreciable gallbladder wall thickening. 2. 2.9 x 2.3 cm left common iliac artery aneurysm, partially calcified. There is aortic and iliac artery atherosclerosis. 3. Multiple renal cysts with a dominant cyst arising from the lower pole  of the right kidney measuring 11.9 x 10.9 cm. No hydronephrosis. No renal or ureteral calculus. 4. Urinary bladder wall thickness upper normal. Advise correlation with urinalysis to exclude possible earliest changes of cystitis. 5. Question postoperative change in the right inguinal region. No bowel containing hernia evident. 6. There is a right scrotal hydrocele with fluid tracking from the right scrotum into the lower inguinal ring. Apparent prosthesis in left scrotum. 7. No bowel obstruction. No abscess in the abdomen or pelvis. No periappendiceal region inflammatory change. Aortic Atherosclerosis (ICD10-I70.0). Electronically Signed   By: Bretta Bang III M.D.   On: 10/12/2019 08:18   CT ANGIO CHEST AORTA W/CM & OR WO/CM  Result Date: 10/12/2019 CLINICAL DATA:  Aortic regurgitation. Ascending thoracic aortic prominence on recent echocardiogram abdominal pain with concern for inguinal hernias EXAM: CT ANGIOGRAPHY CHEST CT ABDOMEN AND PELVIS WITH CONTRAST TECHNIQUE: Multidetector CT imaging of the chest was performed using the standard protocol during bolus administration of intravenous contrast. Multiplanar CT image reconstructions and MIPs were obtained to evaluate the vascular anatomy. Multidetector CT imaging of the abdomen and pelvis was performed using the standard protocol during bolus administration of intravenous contrast. Oral contrast also administered. CONTRAST:  OMNIPAQUE IOHEXOL 350 MG/ML SOLN COMPARISON:  Report of echocardiogram September 28, 2019 FINDINGS: CTA CHEST FINDINGS Cardiovascular: Ascending thoracic aortic diameter at the level of the main pulmonary outflow tract measures 3.9 cm. Ascending thoracic aortic diameter at the level of the sinuses of Valsalva measures 4.2 cm. Measured thoracic aortic diameter at the level of the distal aortic arch measures 3.5 cm. Descending thoracic aortic measurement at the main pulmonary outflow level measures 2.8 x 2.8 cm. No evident thoracic  aortic dissection. Visualized great vessels appear unremarkable. No evident pulmonary embolus. No pericardial effusion or pericardial thickening. There are scattered foci of coronary artery calcification. Mediastinum/Nodes: Visualized thyroid appears unremarkable. There is no evident thoracic adenopathy. No esophageal lesions are appreciable. Lungs/Pleura: There are scattered areas of atelectatic change. There is no evident edema or airspace opacity. No pleural effusions are evident. Musculoskeletal: There are foci of degenerative change in the thoracic spine. No blastic or lytic bone lesions are evident. There is degenerative change in each sternoclavicular joint with vacuum phenomenon in these joints bilaterally. Review of the MIP images confirms the above findings. CT ABDOMEN and PELVIS FINDINGS Hepatobiliary: No focal liver lesions are appreciable. There is a laminated gallstone within the gallbladder. There is  also sludge in the gallbladder. The gallbladder wall does not appear appreciably thickened. There is no evident biliary duct dilatation. Pancreas: There is no pancreatic mass or inflammatory focus. Spleen: No splenic lesions are evident. Adrenals/Urinary Tract: Adrenals appear within normal limits bilaterally. There are multiple renal cysts bilaterally. There is a dominant cyst arising from the inferior right kidney measuring 11.9 x 10.9 cm. Next largest cyst arises in the upper to mid right kidney measuring 7.1 x 5.0 cm. Largest cyst on the left measures 3.5 x 3.1 cm. There are several parapelvic cysts on the left. There is no evident hydronephrosis on either side. There is no appreciable renal or ureteral calculus on either side. Urinary bladder is midline. Urinary bladder wall is upper normal in thickness. Stomach/Bowel: There is no appreciable bowel wall or mesenteric thickening. There is moderate stool throughout the colon. Terminal ileum appears normal. There is no evident bowel obstruction. There  is no free air or portal venous air. Vascular/Lymphatic: Aorta is somewhat tortuous. There is no abdominal aortic aneurysm. There is, however, localized aneurysmal dilatation of the common iliac artery with aneurysm in this area measuring 2.9 x 2.3 cm. There is aortic and iliac artery atherosclerosis. No adenopathy evident in the abdomen or pelvis. Reproductive: There are prostatic calcifications. Prostate and seminal vesicles are normal in size and configuration. No pelvic masses are evident. There is an apparent prosthesis in the left scrotal sac. There is a hydrocele involving the right scrotum with fluid from the right scrotum tracking into the lower inguinal ring. Other: There is soft tissue opacity in the right inguinal ring. Question previous repair in this area. No bowel containing hernia evident. There is no periappendiceal region inflammatory change. No evident abscess or ascites in the abdomen or pelvis. Musculoskeletal: There is degenerative change in the lumbar spine. No blastic or lytic bone lesions. No intramuscular lesions are evident. Review of the MIP images confirms the above findings. IMPRESSION: CT angiogram chest: 1. Ascending thoracic aortic diameter at the sinuses of Valsalva is 4.2 cm. Transverse diameter in the distal aortic arch is 3.5 cm. Ascending thoracic aortic diameter in the mid ascending thoracic region measures 3.9 x 3.9 cm. Recommend annual imaging followup by CTA or MRA. This recommendation follows 2010 ACCF/AHA/AATS/ACR/ASA/SCA/SCAI/SIR/STS/SVM Guidelines for the Diagnosis and Management of Patients with Thoracic Aortic Disease. Circulation.2010; 121: S962-E366. Aortic aneurysm NOS (ICD10-I71.9). There are foci of coronary artery calcification. 2. Scattered areas of atelectatic change. No edema or airspace opacity. 3.  No evident adenopathy. 4.  No demonstrable pulmonary embolus. CT abdomen and pelvis: 1.  Cholelithiasis.  No appreciable gallbladder wall thickening. 2. 2.9 x 2.3  cm left common iliac artery aneurysm, partially calcified. There is aortic and iliac artery atherosclerosis. 3. Multiple renal cysts with a dominant cyst arising from the lower pole of the right kidney measuring 11.9 x 10.9 cm. No hydronephrosis. No renal or ureteral calculus. 4. Urinary bladder wall thickness upper normal. Advise correlation with urinalysis to exclude possible earliest changes of cystitis. 5. Question postoperative change in the right inguinal region. No bowel containing hernia evident. 6. There is a right scrotal hydrocele with fluid tracking from the right scrotum into the lower inguinal ring. Apparent prosthesis in left scrotum. 7. No bowel obstruction. No abscess in the abdomen or pelvis. No periappendiceal region inflammatory change. Aortic Atherosclerosis (ICD10-I70.0). Electronically Signed   By: Lowella Grip III M.D.   On: 10/12/2019 08:18   ECHOCARDIOGRAM COMPLETE  Result Date: 09/29/2019   ECHOCARDIOGRAM REPORT  Patient Name:   FAYETTE HAMADA Date of Exam: 09/28/2019 Medical Rec #:  [id number]          Height:       71.0 in Accession #:    [id number]         Weight:       198.5 lb Date of Birth:  09-01-1950          BSA:          2.10 m Patient Age:    69 years           BP:           138/80 mmHg Patient Gender: M                  HR:           78 bpm. Exam Location:  [facility] Procedure: 2D Echo, Cardiac Doppler and Color Doppler Indications:    I35.1 Nonrheumatic aortic (valve) insufficiency  History:        Patient has no prior history of Echocardiogram examinations.                 Aortic Valve Disease; Risk Factors:Sleep Apnea and Non-Smoker.  Sonographer:    Quentin Ore RDMS, RVT, RDCS Referring Phys: [id number] CHRISTOPHER END  Sonographer Comments: Non-diagnostic parasternal windows, perhaps due to pectus excavatum. Very limited apical windows as well IMPRESSIONS  1. Left ventricular ejection fraction, by visual estimation, is 55 to 60%. The left ventricle has normal  function.  2. Global right ventricle has normal systolic function.The right ventricular size is normal. Right vetricular wall thickness was not assessed.  3. Left atrial size was mildly dilated.  4. Right atrial size was normal.  5. Mild to moderate mitral annular calcification.  6. The mitral valve is degenerative. Mild to moderate mitral valve regurgitation.  7. The tricuspid valve is not well visualized.  8. The tricuspid valve is not well visualized. Tricuspid valve regurgitation is trivial.  9. Aortic valve regurgitation is moderate. 10. The aortic valve is tricuspid. Aortic valve regurgitation is moderate. There is no significant aortic valve stenosis. 11. The pulmonic valve was not well visualized. 12. Aortic dilatation noted. 13. There is mild dilatation of the aortic root and of the transverse aorta measuring 42 mm. 14. Normal pulmonary artery systolic pressure. 15. The inferior vena cava is normal in size with <50% respiratory variability, suggesting right atrial pressure of 8 mmHg. 16. The interatrial septum was not well visualized. FINDINGS  Left Ventricle: Left ventricular ejection fraction, by visual estimation, is 55 to 60%. The left ventricle has normal function. The left ventricle is not well visualized. Left ventricular diastolic parameters were normal. Right Ventricle: The right ventricular size is normal. Right vetricular wall thickness was not assessed. Global RV systolic function is has normal systolic function. The tricuspid regurgitant velocity is 2.32 m/s, and with an assumed right atrial pressure of 8 mmHg, the estimated right ventricular systolic pressure is normal at 29.5 mmHg. Left Atrium: Left atrial size was mildly dilated. Right Atrium: Right atrial size was normal in size Pericardium: There is no evidence of pericardial effusion. Mitral Valve: The mitral valve is degenerative in appearance. There is mild thickening of the mitral valve leaflet(s). Mild to moderate mitral annular  calcification. Mild to moderate mitral valve regurgitation. Tricuspid Valve: The tricuspid valve is not well visualized. Tricuspid valve regurgitation is trivial. Aortic Valve: The aortic valve is tricuspid. Aortic valve regurgitation is moderate. Aortic regurgitation PHT measures  421 msec. There is no significant aortic valve stenosis. Mild aortic valve annular calcification. Aortic valve mean gradient measures 8.0 mmHg. Aortic valve peak gradient measures 17.0 mmHg. Pulmonic Valve: The pulmonic valve was not well visualized. Aorta: Aortic dilatation noted. There is mild dilatation of the aortic root and of the transverse aorta measuring 42 mm. Pulmonary Artery: The pulmonary artery is not well seen. Venous: The inferior vena cava is normal in size with less than 50% respiratory variability, suggesting right atrial pressure of 8 mmHg. IAS/Shunts: The interatrial septum was not well visualized.   LV Volumes (MOD) LV area d, A4C:    48.30 cm Diastology LV area s, A4C:    29.10 cm LV e' lateral:   16.90 cm/s LV major d, A4C:   10.60 cm  LV E/e' lateral: 5.8 LV major s, A4C:   9.21 cm   LV e' medial:    8.81 cm/s LV vol d, MOD A4C: 177.0 ml  LV E/e' medial:  11.2 LV vol s, MOD A4C: 76.9 ml LV SV MOD A4C:     177.0 ml RIGHT VENTRICLE             IVC RV S prime:     28.60 cm/s  IVC diam: 1.90 cm TAPSE (M-mode): 2.7 cm LEFT ATRIUM             Index       RIGHT ATRIUM           Index LA Vol (A2C):   70.6 ml 33.59 ml/m RA Area:     17.40 cm LA Vol (A4C):   62.9 ml 29.93 ml/m RA Volume:   41.40 ml  19.70 ml/m LA Biplane Vol: 71.3 ml 33.92 ml/m  AORTIC VALVE AV Vmax:      206.00 cm/s AV Vmean:     131.000 cm/s AV VTI:       0.382 m AV Peak Grad: 17.0 mmHg AV Mean Grad: 8.0 mmHg AI PHT:       421 msec  AORTA Ao Root diam: 4.20 cm Ao Arch diam: 3.4 cm MITRAL VALVE                        TRICUSPID VALVE MV Area (PHT): 3.08 cm             TR Peak grad:   21.5 mmHg MV PHT:        71.34 msec           TR Vmax:        232.00  cm/s MV Decel Time: 246 msec MV E velocity: 98.50 cm/s 103 cm/s MV A velocity: 47.50 cm/s 70.3 cm/s MV E/A ratio:  2.07       1.5  Yvonne Kendall MD Electronically signed by Yvonne Kendall MD Signature Date/Time: 09/29/2019/7:08:38 AM    Final      Assessment/Plan 1. Iliac artery aneurysm (HCC) Recommend: The iliac artery aneurysm is >3 cm and therefore should undergo repair. Patient is status post CT scan of the abdominal aorta. The patient is a candidate for endovascular repair.   He has cardiac clearance from Dr End.   The patient will continue antiplatelet therapy as prescribed (since the patient is undergoing endovascular repair as opposed to open repair) as well as aggressive management of hyperlipidemia. Exercise is again strongly encouraged.   The patient is reminded that lifetime routine surveillance is a necessity with an endograft.   The risks and benefits of  endovascular aneurysm repair are reviewed with the patient.  All questions are answered.  Alternative therapies are also discussed.  The patient agrees to proceed with endovascular aneurysm repair.  Patient will follow-up with me in the office after the surgery.  2. Peripheral venous insufficiency No surgery or intervention at this point in time.    I have had a long discussion with the patient regarding venous insufficiency and why it  causes symptoms. I have discussed with the patient the chronic skin changes that accompany venous insufficiency and the long term sequela such as infection and ulceration.  Patient will begin wearing graduated compression stockings class 1 (20-30 mmHg) or compression wraps on a daily basis a prescription was given. The patient will put the stockings on first thing in the morning and removing them in the evening. The patient is instructed specifically not to sleep in the stockings.    In addition, behavioral modification including several periods of elevation of the lower extremities during the  day will be continued. I have demonstrated that proper elevation is a position with the ankles at heart level.  The patient is instructed to begin routine exercise, especially walking on a daily basis  The patient will follow up reassess the degree of swelling and the control that graduated compression stockings or compression wraps  is offering.   The patient can be assessed for a Lymph Pump at that time  3. Mild intermittent asthma, uncomplicated Continue pulmonary medications and aerosols as already ordered, these medications have been reviewed and there are no changes at this time.    4. Primary osteoarthritis of both knees Continue NSAID medications as already ordered, these medications have been reviewed and there are no changes at this time.  Continued activity and therapy was stressed.     Levora Dredge, MD  10/27/2019 2:43 PM

## 2019-11-03 ENCOUNTER — Encounter (INDEPENDENT_AMBULATORY_CARE_PROVIDER_SITE_OTHER): Payer: Self-pay

## 2019-11-03 ENCOUNTER — Telehealth (INDEPENDENT_AMBULATORY_CARE_PROVIDER_SITE_OTHER): Payer: Self-pay

## 2019-11-03 NOTE — Telephone Encounter (Signed)
I attempted to contact the patient regarding his surgery with Dr. Gilda Crease that is scheduled on 11/17/19. Patient has a phone call pre-op scheduled on 11/12/19 between 8-1 pm and covid testing scheduled on 11/15/19 between 12:30-2:30 pm at the [facility]. A message was left for a return call.

## 2019-11-04 NOTE — Telephone Encounter (Signed)
Patient called back and requested to have his surgery scheduled out to 12/08/19 with Dr. Gilda Crease. Patient has been rescheduled to 12/08/19 for a Left common iliac stent placement. Patient will do a phone pre-op on 11/25/19 between 1-5 pm and covid testing on 12/06/19 between 12:30-2:30 pm at the [facility]. Pre-surgical instructions will be mailed to the patient.

## 2019-11-12 ENCOUNTER — Other Ambulatory Visit: Payer: Medicare Other

## 2019-11-15 ENCOUNTER — Other Ambulatory Visit: Payer: Medicare Other

## 2019-11-24 ENCOUNTER — Other Ambulatory Visit (INDEPENDENT_AMBULATORY_CARE_PROVIDER_SITE_OTHER): Payer: Self-pay | Admitting: Nurse Practitioner

## 2019-11-25 ENCOUNTER — Encounter
Admission: RE | Admit: 2019-11-25 | Discharge: 2019-11-25 | Disposition: A | Discharge status: Home / Self Care | Payer: Medicare Other | Source: Ambulatory Visit | Attending: Vascular Surgery | Admitting: Vascular Surgery

## 2019-11-25 ENCOUNTER — Other Ambulatory Visit: Payer: Self-pay

## 2019-11-25 HISTORY — DX: Other specified postprocedural states: R11.2

## 2019-11-25 HISTORY — DX: Pneumonia, unspecified organism: J18.9

## 2019-11-25 HISTORY — DX: Unspecified osteoarthritis, unspecified site: M19.90

## 2019-11-25 HISTORY — DX: Other specified postprocedural states: Z98.890

## 2019-11-25 NOTE — Patient Instructions (Addendum)
Your procedure is scheduled on: Wednesday, April 14 Report to the Registration Desk at 6:30 am at the [facility].  REMEMBER: Instructions that are not followed completely may result in serious medical risk, up to and including death; or upon the discretion of your surgeon and anesthesiologist your surgery may need to be rescheduled.  Do not eat or drink anything after midnight the night before surgery.  No gum chewing, lozengers or hard candies.  DO NOT TAKE ANY ORAL MEDICATIONS THE MORNING OF SURGERY   Use the Pulmicort nebulizer prior to arrival and the Symbicort inhaler on the day of surgery.  Stop Anti-inflammatories (NSAIDS) such as Advil, Aleve, Ibuprofen, Motrin, Naproxen, Naprosyn and Aspirin based products such as Excedrin, Goodys Powder, BC Powder. (May take Tylenol or Acetaminophen if needed.)  Stop ANY OVER THE COUNTER supplements until after surgery. Vitamin C, Omega 3, Milk Thistle, Joint Health, Turmeric (May continue Vitamin D, Vitamin B.)  No Alcohol for 24 hours before or after surgery.  No Smoking including e-cigarettes for 24 hours prior to surgery.   On the morning of surgery brush your teeth with toothpaste and water, you may rinse your mouth with mouthwash if you wish. Do not swallow any toothpaste or mouthwash.  Do not wear jewelry.  Do not wear lotions, powders, or perfumes.   Contact lenses, hearing aids and dentures may not be worn into surgery.  Do not bring valuables to the hospital, including drivers license, insurance or credit cards.  [facility] is not responsible for any belongings or valuables.   Use CHG Soap as directed on instruction sheet.  Notify your doctor if there is any change in your medical condition (cold, fever, infection).  Wear comfortable clothing (specific to your surgery type) to the hospital.  If you are being admitted to the hospital overnight, leave your suitcase in the car. After surgery it may be brought to your  room.  If you are being discharged the day of surgery, you will not be allowed to drive home. You will need a responsible adult to drive you home and stay with you that night.   If you are taking public transportation, you will need to have a responsible adult with you. Please confirm with your physician that it is acceptable to use public transportation.   Please call [phone] if you have any questions about these instructions.  Visitation Policy:  Patients undergoing a surgery or procedure in a hospital may have one family member or support person with them as long as that person is not COVID-19 positive or experiencing its symptoms. That person may remain in the waiting area during the procedure. Should the patient need to stay at the hospital during part of their recovery, the support person may visit during visiting hours; 7 am to 8 pm.  Inpatient Visitation Update:  Two designated support people may visit a patient during visiting hours 7 am to 8 pm. It must be the same two designated people for the duration of the patient stay. The visitors may come and go during the day, and there is no switching out to have different visitors. A mask must be worn at all times, including in the patient room.

## 2019-12-01 ENCOUNTER — Other Ambulatory Visit
Admission: RE | Admit: 2019-12-01 | Discharge: 2019-12-01 | Disposition: A | Discharge status: Home / Self Care | Payer: Medicare Other | Source: Ambulatory Visit | Attending: Gastroenterology | Admitting: Gastroenterology

## 2019-12-01 DIAGNOSIS — Z01812 Encounter for preprocedural laboratory examination: Secondary | ICD-10-CM | POA: Insufficient documentation

## 2019-12-01 DIAGNOSIS — Z20822 Contact with and (suspected) exposure to covid-19: Secondary | ICD-10-CM | POA: Insufficient documentation

## 2019-12-01 LAB — SARS CORONAVIRUS 2 (TAT 6-24 HRS): SARS Coronavirus 2: NEGATIVE

## 2019-12-03 ENCOUNTER — Encounter
Admission: RE | Disposition: A | Discharge status: Home / Self Care | Payer: Self-pay | Source: Home / Self Care | Attending: Gastroenterology

## 2019-12-03 ENCOUNTER — Ambulatory Visit
Admission: RE | Admit: 2019-12-03 | Discharge: 2019-12-03 | Disposition: A | Discharge status: Home / Self Care | Payer: Medicare Other | Attending: Gastroenterology | Admitting: Gastroenterology

## 2019-12-03 ENCOUNTER — Other Ambulatory Visit: Payer: Self-pay

## 2019-12-03 ENCOUNTER — Ambulatory Visit: Payer: Medicare Other | Admitting: Certified Registered Nurse Anesthetist

## 2019-12-03 ENCOUNTER — Encounter: Payer: Self-pay | Admitting: Gastroenterology

## 2019-12-03 SURGERY — COLONOSCOPY WITH PROPOFOL
Anesthesia: General

## 2019-12-03 NOTE — Anesthesia Preprocedure Evaluation (Deleted)
Anesthesia Evaluation  Patient identified by MRN, date of birth, ID band  History of Anesthesia Complications (+) PONV and history of anesthetic complications  Airway Mallampati: II       Dental   Pulmonary asthma , sleep apnea ,    Pulmonary exam normal        Cardiovascular hypertension, Normal cardiovascular exam+ Valvular Problems/Murmurs AI and MR      Neuro/Psych negative neurological ROS  negative psych ROS   GI/Hepatic Neg liver ROS,   Endo/Other  negative endocrine ROS  Renal/GU negative Renal ROS  negative genitourinary   Musculoskeletal  (+) Arthritis ,   Abdominal Normal abdominal exam  (+)   Peds negative pediatric ROS (+)  Hematology negative hematology ROS (+)   Anesthesia Other Findings   Reproductive/Obstetrics                             Anesthesia Physical Anesthesia Plan  ASA: III  Anesthesia Plan: General   Post-op Pain Management:    Induction: Intravenous  PONV Risk Score and Plan:   Airway Management Planned: Nasal Cannula  Additional Equipment:   Intra-op Plan:   Post-operative Plan:   Informed Consent: I have reviewed the patients History and Physical, chart, labs and discussed the procedure including the risks, benefits and alternatives for the proposed anesthesia with the patient or authorized representative who has indicated his/her understanding and acceptance.     Dental advisory given  Plan Discussed with: CRNA and Surgeon  Anesthesia Plan Comments:         Anesthesia Quick Evaluation

## 2019-12-03 NOTE — OR Nursing (Signed)
Dr. Tobi Bastos into see pt, Colonoscopy cancelled due to poor prep.  Pt has a history of having a difficult time getting clean out.

## 2019-12-06 ENCOUNTER — Other Ambulatory Visit: Payer: Self-pay

## 2019-12-06 ENCOUNTER — Other Ambulatory Visit
Admission: RE | Admit: 2019-12-06 | Discharge: 2019-12-06 | Disposition: A | Discharge status: Home / Self Care | Payer: Medicare Other | Source: Ambulatory Visit | Attending: Vascular Surgery | Admitting: Vascular Surgery

## 2019-12-06 DIAGNOSIS — I723 Aneurysm of iliac artery: Secondary | ICD-10-CM | POA: Insufficient documentation

## 2019-12-06 DIAGNOSIS — Z01812 Encounter for preprocedural laboratory examination: Secondary | ICD-10-CM | POA: Insufficient documentation

## 2019-12-06 DIAGNOSIS — Z20822 Contact with and (suspected) exposure to covid-19: Secondary | ICD-10-CM | POA: Insufficient documentation

## 2019-12-06 LAB — PROTIME-INR
INR: 1.1 (ref 0.8–1.2)
Prothrombin Time: 13.8 seconds (ref 11.4–15.2)

## 2019-12-06 LAB — CBC WITH DIFFERENTIAL/PLATELET
Abs Immature Granulocytes: 0.01 10*3/uL (ref 0.00–0.07)
Basophils Absolute: 0 10*3/uL (ref 0.0–0.1)
Basophils Relative: 0 %
Eosinophils Absolute: 0.1 10*3/uL (ref 0.0–0.5)
Eosinophils Relative: 3 %
HCT: 45.9 % (ref 39.0–52.0)
Hemoglobin: 14.9 g/dL (ref 13.0–17.0)
Immature Granulocytes: 0 %
Lymphocytes Relative: 24 %
Lymphs Abs: 1.2 10*3/uL (ref 0.7–4.0)
MCH: 31 pg (ref 26.0–34.0)
MCHC: 32.5 g/dL (ref 30.0–36.0)
MCV: 95.4 fL (ref 80.0–100.0)
Monocytes Absolute: 0.4 10*3/uL (ref 0.1–1.0)
Monocytes Relative: 8 %
Neutro Abs: 3.1 10*3/uL (ref 1.7–7.7)
Neutrophils Relative %: 65 %
Platelets: 183 10*3/uL (ref 150–400)
RBC: 4.81 MIL/uL (ref 4.22–5.81)
RDW: 13.4 % (ref 11.5–15.5)
WBC: 4.8 10*3/uL (ref 4.0–10.5)
nRBC: 0 % (ref 0.0–0.2)

## 2019-12-06 LAB — SARS CORONAVIRUS 2 (TAT 6-24 HRS): SARS Coronavirus 2: NEGATIVE

## 2019-12-06 LAB — TYPE AND SCREEN
ABO/RH(D): A POS
Antibody Screen: NEGATIVE

## 2019-12-06 LAB — BASIC METABOLIC PANEL
Anion gap: 7 (ref 5–15)
BUN: 17 mg/dL (ref 8–23)
CO2: 28 mmol/L (ref 22–32)
Calcium: 9.2 mg/dL (ref 8.9–10.3)
Chloride: 104 mmol/L (ref 98–111)
Creatinine, Ser: 0.95 mg/dL (ref 0.61–1.24)
GFR calc Af Amer: >60 mL/min (ref >60)
GFR calc non Af Amer: >60 mL/min (ref >60)
Glucose, Bld: 102 mg/dL — ABNORMAL HIGH (ref 70–99)
Potassium: 4 mmol/L (ref 3.5–5.1)
Sodium: 139 mmol/L (ref 135–145)

## 2019-12-06 LAB — SURGICAL PCR SCREEN
MRSA, PCR: NEGATIVE
Staphylococcus aureus: NEGATIVE

## 2019-12-06 LAB — APTT: aPTT: 33 seconds (ref 24–36)

## 2019-12-07 MED ORDER — CLINDAMYCIN PHOSPHATE 300 MG/50ML IV SOLN
300.0000 mg | INTRAVENOUS | Status: AC
  Administered 2019-12-08: 300 mg via INTRAVENOUS

## 2019-12-08 ENCOUNTER — Encounter
Admission: RE | Disposition: A | Discharge status: Home / Self Care | Payer: Self-pay | Source: Home / Self Care | Attending: Vascular Surgery

## 2019-12-08 ENCOUNTER — Inpatient Hospital Stay
Admission: RE | Admit: 2019-12-08 | Discharge: 2019-12-09 | DRG: 272 | Disposition: A | Discharge status: Home / Self Care | Payer: Medicare Other | Attending: Vascular Surgery | Admitting: Vascular Surgery

## 2019-12-08 ENCOUNTER — Other Ambulatory Visit: Payer: Self-pay

## 2019-12-08 ENCOUNTER — Inpatient Hospital Stay: Payer: Medicare Other | Admitting: Certified Registered Nurse Anesthetist

## 2019-12-08 ENCOUNTER — Encounter: Payer: Self-pay | Admitting: Vascular Surgery

## 2019-12-08 DIAGNOSIS — J4520 Mild intermittent asthma, uncomplicated: Secondary | ICD-10-CM | POA: Diagnosis present

## 2019-12-08 DIAGNOSIS — Z880 Allergy status to penicillin: Secondary | ICD-10-CM | POA: Diagnosis not present

## 2019-12-08 DIAGNOSIS — Z8249 Family history of ischemic heart disease and other diseases of the circulatory system: Secondary | ICD-10-CM | POA: Diagnosis not present

## 2019-12-08 DIAGNOSIS — M170 Bilateral primary osteoarthritis of knee: Secondary | ICD-10-CM | POA: Diagnosis present

## 2019-12-08 DIAGNOSIS — I723 Aneurysm of iliac artery: Secondary | ICD-10-CM | POA: Diagnosis present

## 2019-12-08 DIAGNOSIS — Z8261 Family history of arthritis: Secondary | ICD-10-CM

## 2019-12-08 DIAGNOSIS — Z809 Family history of malignant neoplasm, unspecified: Secondary | ICD-10-CM | POA: Diagnosis not present

## 2019-12-08 DIAGNOSIS — G4730 Sleep apnea, unspecified: Secondary | ICD-10-CM | POA: Diagnosis present

## 2019-12-08 DIAGNOSIS — I10 Essential (primary) hypertension: Secondary | ICD-10-CM | POA: Diagnosis present

## 2019-12-08 DIAGNOSIS — Z20822 Contact with and (suspected) exposure to covid-19: Secondary | ICD-10-CM | POA: Diagnosis present

## 2019-12-08 DIAGNOSIS — I872 Venous insufficiency (chronic) (peripheral): Secondary | ICD-10-CM | POA: Diagnosis present

## 2019-12-08 DIAGNOSIS — J45909 Unspecified asthma, uncomplicated: Secondary | ICD-10-CM | POA: Diagnosis present

## 2019-12-08 DIAGNOSIS — Z888 Allergy status to other drugs, medicaments and biological substances status: Secondary | ICD-10-CM | POA: Diagnosis not present

## 2019-12-08 HISTORY — PX: ENDOVASCULAR REPAIR/STENT GRAFT: CATH118280

## 2019-12-08 LAB — ABO/RH: ABO/RH(D): A POS

## 2019-12-08 LAB — GLUCOSE, CAPILLARY: Glucose-Capillary: 112 mg/dL — ABNORMAL HIGH (ref 70–99)

## 2019-12-08 SURGERY — ENDOVASCULAR REPAIR/STENT GRAFT
Anesthesia: General

## 2019-12-08 MED ORDER — IODIXANOL 320 MG/ML IV SOLN
INTRAVENOUS | Status: DC | PRN
  Administered 2019-12-08: 50 mL via INTRA_ARTERIAL

## 2019-12-08 MED ORDER — LACTATED RINGERS IV SOLN: INTRAVENOUS | Status: DC | PRN

## 2019-12-08 MED ORDER — FENTANYL CITRATE (PF) 100 MCG/2ML IJ SOLN
INTRAMUSCULAR | Status: AC
  Filled 2019-12-08: qty 2

## 2019-12-08 MED ORDER — MIDAZOLAM HCL 2 MG/2ML IJ SOLN
INTRAMUSCULAR | Status: DC | PRN
  Administered 2019-12-08: 2 mg via INTRAVENOUS

## 2019-12-08 MED ORDER — HYDROMORPHONE HCL 1 MG/ML IJ SOLN: 1.0000 mg | Freq: Once | INTRAMUSCULAR | Status: DC | PRN

## 2019-12-08 MED ORDER — FENTANYL CITRATE (PF) 100 MCG/2ML IJ SOLN
25.0000 ug | INTRAMUSCULAR | Status: DC | PRN
  Administered 2019-12-08: 25 ug via INTRAVENOUS

## 2019-12-08 MED ORDER — ONDANSETRON HCL 4 MG/2ML IJ SOLN
INTRAMUSCULAR | Status: AC
  Filled 2019-12-08: qty 2

## 2019-12-08 MED ORDER — DOPAMINE-DEXTROSE 3.2-5 MG/ML-% IV SOLN: 3.0000 ug/kg/min | INTRAVENOUS | Status: DC

## 2019-12-08 MED ORDER — ASPIRIN EC 81 MG PO TBEC
81.0000 mg | DELAYED_RELEASE_TABLET | Freq: Every day | ORAL | Status: DC
  Administered 2019-12-09: 81 mg via ORAL
  Filled 2019-12-08: qty 1

## 2019-12-08 MED ORDER — CLOPIDOGREL BISULFATE 75 MG PO TABS
75.0000 mg | ORAL_TABLET | Freq: Every day | ORAL | Status: DC
  Administered 2019-12-09: 75 mg via ORAL
  Filled 2019-12-08: qty 1

## 2019-12-08 MED ORDER — ACETAMINOPHEN 325 MG PO TABS
325.0000 mg | ORAL_TABLET | ORAL | Status: DC | PRN
  Administered 2019-12-09: 650 mg via ORAL
  Filled 2019-12-08: qty 2

## 2019-12-08 MED ORDER — POTASSIUM CHLORIDE CRYS ER 20 MEQ PO TBCR: 20.0000 meq | EXTENDED_RELEASE_TABLET | Freq: Every day | ORAL | Status: DC | PRN

## 2019-12-08 MED ORDER — ONDANSETRON HCL 4 MG/2ML IJ SOLN
4.0000 mg | Freq: Four times a day (QID) | INTRAMUSCULAR | Status: DC | PRN
  Administered 2019-12-08: 4 mg via INTRAVENOUS

## 2019-12-08 MED ORDER — METOPROLOL TARTRATE 5 MG/5ML IV SOLN: 2.0000 mg | INTRAVENOUS | Status: DC | PRN

## 2019-12-08 MED ORDER — PHENYLEPHRINE HCL (PRESSORS) 10 MG/ML IV SOLN
INTRAVENOUS | Status: AC
  Filled 2019-12-08: qty 1

## 2019-12-08 MED ORDER — ALUM & MAG HYDROXIDE-SIMETH 200-200-20 MG/5ML PO SUSP: 15.0000 mL | ORAL | Status: DC | PRN

## 2019-12-08 MED ORDER — CHLORHEXIDINE GLUCONATE CLOTH 2 % EX PADS
6.0000 | MEDICATED_PAD | Freq: Every day | CUTANEOUS | Status: DC
  Administered 2019-12-08 – 2019-12-09 (×2): 6 via TOPICAL

## 2019-12-08 MED ORDER — ONDANSETRON HCL 4 MG/2ML IJ SOLN: 4.0000 mg | Freq: Four times a day (QID) | INTRAMUSCULAR | Status: DC | PRN

## 2019-12-08 MED ORDER — CLINDAMYCIN PHOSPHATE 300 MG/50ML IV SOLN
INTRAVENOUS | Status: AC
  Filled 2019-12-08: qty 50

## 2019-12-08 MED ORDER — PROPOFOL 10 MG/ML IV BOLUS
INTRAVENOUS | Status: AC
  Filled 2019-12-08: qty 40

## 2019-12-08 MED ORDER — FENTANYL CITRATE (PF) 100 MCG/2ML IJ SOLN
INTRAMUSCULAR | Status: DC | PRN
  Administered 2019-12-08 (×2): 50 ug via INTRAVENOUS

## 2019-12-08 MED ORDER — MAGNESIUM SULFATE 2 GM/50ML IV SOLN: 2.0000 g | Freq: Every day | INTRAVENOUS | Status: DC | PRN

## 2019-12-08 MED ORDER — ROCURONIUM BROMIDE 100 MG/10ML IV SOLN
INTRAVENOUS | Status: DC | PRN
  Administered 2019-12-08: 30 mg via INTRAVENOUS
  Administered 2019-12-08: 50 mg via INTRAVENOUS
  Administered 2019-12-08: 20 mg via INTRAVENOUS

## 2019-12-08 MED ORDER — CHLORHEXIDINE GLUCONATE CLOTH 2 % EX PADS: 6.0000 | MEDICATED_PAD | Freq: Once | CUTANEOUS | Status: DC

## 2019-12-08 MED ORDER — NITROGLYCERIN IN D5W 200-5 MCG/ML-% IV SOLN: 5.0000 ug/min | INTRAVENOUS | Status: DC

## 2019-12-08 MED ORDER — SODIUM CHLORIDE 0.9 % IV SOLN: INTRAVENOUS | Status: AC

## 2019-12-08 MED ORDER — GUAIFENESIN-DM 100-10 MG/5ML PO SYRP: 15.0000 mL | ORAL_SOLUTION | ORAL | Status: DC | PRN

## 2019-12-08 MED ORDER — SUGAMMADEX SODIUM 200 MG/2ML IV SOLN
INTRAVENOUS | Status: DC | PRN
  Administered 2019-12-08: 168.8 mg via INTRAVENOUS

## 2019-12-08 MED ORDER — LIDOCAINE HCL (PF) 2 % IJ SOLN
INTRAMUSCULAR | Status: AC
  Filled 2019-12-08: qty 5

## 2019-12-08 MED ORDER — BUDESONIDE 0.5 MG/2ML IN SUSP
0.5000 mg | Freq: Two times a day (BID) | RESPIRATORY_TRACT | Status: DC
  Administered 2019-12-08: 0.5 mg via RESPIRATORY_TRACT
  Filled 2019-12-08: qty 2

## 2019-12-08 MED ORDER — DOCUSATE SODIUM 100 MG PO CAPS
100.0000 mg | ORAL_CAPSULE | Freq: Every day | ORAL | Status: DC
  Administered 2019-12-09: 100 mg via ORAL

## 2019-12-08 MED ORDER — OXYCODONE-ACETAMINOPHEN 5-325 MG PO TABS: 1.0000 | ORAL_TABLET | ORAL | Status: DC | PRN

## 2019-12-08 MED ORDER — GLYCOPYRROLATE 0.2 MG/ML IJ SOLN
INTRAMUSCULAR | Status: AC
  Filled 2019-12-08: qty 1

## 2019-12-08 MED ORDER — LABETALOL HCL 5 MG/ML IV SOLN: 10.0000 mg | INTRAVENOUS | Status: DC | PRN

## 2019-12-08 MED ORDER — MORPHINE SULFATE (PF) 2 MG/ML IV SOLN: 2.0000 mg | INTRAVENOUS | Status: DC | PRN

## 2019-12-08 MED ORDER — ACETAMINOPHEN 650 MG RE SUPP: 325.0000 mg | RECTAL | Status: DC | PRN

## 2019-12-08 MED ORDER — DEXAMETHASONE SODIUM PHOSPHATE 10 MG/ML IJ SOLN
INTRAMUSCULAR | Status: AC
  Filled 2019-12-08: qty 1

## 2019-12-08 MED ORDER — HEPARIN SODIUM (PORCINE) 1000 UNIT/ML IJ SOLN
INTRAMUSCULAR | Status: AC
  Filled 2019-12-08: qty 1

## 2019-12-08 MED ORDER — FAMOTIDINE IN NACL 20-0.9 MG/50ML-% IV SOLN
20.0000 mg | Freq: Two times a day (BID) | INTRAVENOUS | Status: DC
  Administered 2019-12-08: 20 mg via INTRAVENOUS
  Filled 2019-12-08: qty 50

## 2019-12-08 MED ORDER — DEXAMETHASONE SODIUM PHOSPHATE 10 MG/ML IJ SOLN
INTRAMUSCULAR | Status: DC | PRN
  Administered 2019-12-08: 10 mg via INTRAVENOUS

## 2019-12-08 MED ORDER — HEPARIN SODIUM (PORCINE) 1000 UNIT/ML IJ SOLN
INTRAMUSCULAR | Status: DC | PRN
  Administered 2019-12-08: 5000 [IU] via INTRAVENOUS

## 2019-12-08 MED ORDER — SODIUM CHLORIDE 0.9 % IV SOLN: 500.0000 mL | Freq: Once | INTRAVENOUS | Status: DC | PRN

## 2019-12-08 MED ORDER — LIDOCAINE HCL (CARDIAC) PF 100 MG/5ML IV SOSY
PREFILLED_SYRINGE | INTRAVENOUS | Status: DC | PRN
  Administered 2019-12-08: 100 mg via INTRAVENOUS

## 2019-12-08 MED ORDER — SODIUM CHLORIDE 0.9 % IV SOLN
INTRAVENOUS | Status: DC | PRN
  Administered 2019-12-08: 08:00:00 15 mL/h via INTRAVENOUS

## 2019-12-08 MED ORDER — MIDAZOLAM HCL 2 MG/2ML IJ SOLN
INTRAMUSCULAR | Status: AC
  Filled 2019-12-08: qty 2

## 2019-12-08 MED ORDER — HYDRALAZINE HCL 20 MG/ML IJ SOLN: 5.0000 mg | INTRAMUSCULAR | Status: DC | PRN

## 2019-12-08 MED ORDER — FLUTICASONE FUROATE-VILANTEROL 100-25 MCG/INH IN AEPB
1.0000 | INHALATION_SPRAY | Freq: Every day | RESPIRATORY_TRACT | Status: DC
  Administered 2019-12-09: 1 via RESPIRATORY_TRACT
  Filled 2019-12-08: qty 28

## 2019-12-08 MED ORDER — LACTATED RINGERS IV SOLN: INTRAVENOUS | Status: DC

## 2019-12-08 MED ORDER — ROCURONIUM BROMIDE 10 MG/ML (PF) SYRINGE
PREFILLED_SYRINGE | INTRAVENOUS | Status: AC
  Filled 2019-12-08: qty 10

## 2019-12-08 MED ORDER — PROPOFOL 10 MG/ML IV BOLUS
INTRAVENOUS | Status: DC | PRN
  Administered 2019-12-08: 120 mg via INTRAVENOUS

## 2019-12-08 MED ORDER — PHENOL 1.4 % MT LIQD
1.0000 | OROMUCOSAL | Status: DC | PRN
  Filled 2019-12-08: qty 177

## 2019-12-08 MED ORDER — SUCCINYLCHOLINE CHLORIDE 200 MG/10ML IV SOSY
PREFILLED_SYRINGE | INTRAVENOUS | Status: AC
  Filled 2019-12-08: qty 10

## 2019-12-08 MED ORDER — EPHEDRINE 5 MG/ML INJ
INTRAVENOUS | Status: AC
  Filled 2019-12-08: qty 10

## 2019-12-08 MED ORDER — VANCOMYCIN HCL IN DEXTROSE 1-5 GM/200ML-% IV SOLN
1000.0000 mg | Freq: Two times a day (BID) | INTRAVENOUS | Status: AC
  Administered 2019-12-08 – 2019-12-09 (×2): 1000 mg via INTRAVENOUS
  Filled 2019-12-08 (×2): qty 200

## 2019-12-08 MED ORDER — ONDANSETRON HCL 4 MG/2ML IJ SOLN: 4.0000 mg | Freq: Once | INTRAMUSCULAR | Status: DC | PRN

## 2019-12-08 SURGICAL SUPPLY — 33 items
BALLN ATLAS 14X40X75 (BALLOONS) ×3
BALLOON ATLAS 14X40X75 (BALLOONS) ×1 IMPLANT
CATH ACCU-VU SIZ PIG 5F 70CM (CATHETERS) ×3 IMPLANT
CATH BALLN CODA 9X100X32 (BALLOONS) ×3 IMPLANT
CATH BEACON 5 .035 65 KMP TIP (CATHETERS) ×3 IMPLANT
COVER PROBE U/S 5X48 (MISCELLANEOUS) ×3 IMPLANT
COVER SURGICAL LIGHT HANDLE (MISCELLANEOUS) ×3 IMPLANT
DEVICE CLOSURE PERCLS PRGLD 6F (VASCULAR PRODUCTS) ×6 IMPLANT
DEVICE ENSNARE  12MMX20MM (VASCULAR PRODUCTS) ×2
DEVICE ENSNARE 12MMX20MM (VASCULAR PRODUCTS) ×1 IMPLANT
DEVICE PRESTO INFLATION (MISCELLANEOUS) ×6 IMPLANT
DEVICE TORQUE .025-.038 (MISCELLANEOUS) ×3 IMPLANT
DRYSEAL FLEXSHEATH 12FR 45CM (SHEATH) ×4
DRYSEAL FLEXSHEATH 16FR 33CM (SHEATH) ×2
ENDOPRO ILIAC 23X14X10 FA (Endovascular Graft) ×3 IMPLANT
ENDOPRO ILIAC HC 16X14X7 FA (Endovascular Graft) ×3 IMPLANT
ENDOPROSTHESIS ILI  16X14X7 FA (Endovascular Graft) ×1 IMPLANT
ENDOPROSTHESIS ILI 23X14X10 FA (Endovascular Graft) ×1 IMPLANT
GLIDEWIRE ANGLED SS 035X260CM (WIRE) ×3 IMPLANT
GLIDEWIRE STIFF .35X180X3 HYDR (WIRE) ×3 IMPLANT
NEEDLE ENTRY 21GA 7CM ECHOTIP (NEEDLE) ×3 IMPLANT
PACK ANGIOGRAPHY (CUSTOM PROCEDURE TRAY) ×3 IMPLANT
PERCLOSE PROGLIDE 6F (VASCULAR PRODUCTS) ×18
SET INTRO CAPELLA COAXIAL (SET/KITS/TRAYS/PACK) ×3 IMPLANT
SHEATH BRITE TIP 5FRX11 (SHEATH) ×6 IMPLANT
SHEATH BRITE TIP 8FRX11 (SHEATH) ×6 IMPLANT
SHEATH DRYSEAL FLEX 12FR 45CM (SHEATH) ×2 IMPLANT
SHEATH DRYSEAL FLEX 16FR 33CM (SHEATH) ×1 IMPLANT
SPONGE XRAY 4X4 16PLY STRL (MISCELLANEOUS) ×6 IMPLANT
TUBE CONN 8.8X1320 FR HP M-F (CONNECTOR) ×6 IMPLANT
WIRE AMPLATZ SSTIFF .035X260CM (WIRE) ×6 IMPLANT
WIRE G VAS 035X260 STIFF (WIRE) ×3 IMPLANT
WIRE J 3MM .035X145CM (WIRE) ×6 IMPLANT

## 2019-12-08 NOTE — H&P (Signed)
[facility] History & Physical Update  The patient was interviewed and re-examined.  The patient's previous History and Physical has been reviewed and is unchanged.  There is no change in the plan of care. We plan to proceed with the scheduled procedure.  Levora Dredge, MD  12/08/2019, 7:36 AM

## 2019-12-08 NOTE — Anesthesia Preprocedure Evaluation (Signed)
Anesthesia Evaluation  Patient identified by MRN, date of birth, ID band Patient awake    Reviewed: Allergy & Precautions, NPO status , Patient's Chart, lab work & pertinent test results  History of Anesthesia Complications (+) PONV and history of anesthetic complications  Airway Mallampati: II  TM Distance: >3 FB Neck ROM: Full    Dental no notable dental hx.    Pulmonary asthma (uses daily controller) , sleep apnea and Continuous Positive Airway Pressure Ventilation ,    breath sounds clear to auscultation- rhonchi (-) wheezing      Cardiovascular hypertension, + Peripheral Vascular Disease (AAA)  (-) CAD, (-) Past MI, (-) Cardiac Stents and (-) CABG  Rhythm:Regular Rate:Normal - Systolic murmurs and - Diastolic murmurs    Neuro/Psych neg Seizures negative neurological ROS  negative psych ROS   GI/Hepatic negative GI ROS, Neg liver ROS,   Endo/Other  negative endocrine ROSneg diabetes  Renal/GU negative Renal ROS     Musculoskeletal  (+) Arthritis ,   Abdominal (+) - obese,   Peds  Hematology negative hematology ROS (+)   Anesthesia Other Findings Past Medical History: No date: Aortic regurgitation No date: Arthritis No date: Asthma No date: Hernia, inguinal, recurrent No date: Nasal polyps No date: Pneumonia No date: PONV (postoperative nausea and vomiting)   Reproductive/Obstetrics                             Anesthesia Physical Anesthesia Plan  ASA: III  Anesthesia Plan: General   Post-op Pain Management:    Induction: Intravenous  PONV Risk Score and Plan: 2 and Ondansetron, Dexamethasone and Midazolam  Airway Management Planned: Oral ETT  Additional Equipment:   Intra-op Plan:   Post-operative Plan: Extubation in OR  Informed Consent: I have reviewed the patients History and Physical, chart, labs and discussed the procedure including the risks, benefits and  alternatives for the proposed anesthesia with the patient or authorized representative who has indicated his/her understanding and acceptance.     Dental advisory given  Plan Discussed with: CRNA and Anesthesiologist  Anesthesia Plan Comments:         Anesthesia Quick Evaluation

## 2019-12-08 NOTE — Op Note (Signed)
OPERATIVE NOTE     PROCEDURE: 1. US guidance for vascular access, bilateral femoral arteries 2. Catheter placement into right femoral artery from left femoral approach 3. Catheter placement into secondary branches of the left internal iliac artery from the right femoral approach 4. Placement of a C3 Gore Excluder bifurcated iliac segment 23 mm proximal in the left common iliac artery, 14 cm distal in the external iliac artery, and 10 cm in length  5. Placement of a 14 mm diameter by 7 cm length left hypogastric artery extender limb into the left internal iliac artery 6. Pro glide closures utilized in a pre-close fashion bilateral common femoral arteries   PRE-OPERATIVE DIAGNOSIS: Left iliac artery aneurysm   POST-OPERATIVE DIAGNOSIS: same   SURGEON: Richard Dredge, MD and Richard Barren, MD - Co-surgeons   ANESTHESIA: general   ESTIMATED BLOOD LOSS: 25 cc   FINDING(S): 1.    Left common iliac artery aneurysm extending down to the iliac bifurcation   SPECIMEN(S):  none   INDICATIONS:   Richard Wolfe is a 69 y.o. y.o. male who presents with a left common iliac artery aneurysm extending to the iliac bifurcation. The patient will require endovascular repair to prevent lethal rupture. Risks and benefits are discussed and the patient is agreeable to proceed. Co-surgeons are used to expedite the procedure and reduce operative time as bilateral work needs to be done.   DESCRIPTION: After obtaining full informed written consent, the patient was brought back to the operating room and placed supine upon the operating table.  The patient received IV antibiotics prior to induction.  After obtaining adequate anesthesia, the patient was prepped and draped in the standard fashion for endovascular aneurysm repair.  We then began by gaining access to both femoral arteries with US guidance with me working on the right and Dr. Gilda Wolfe working on the left.  The femoral arteries were found to be patent and  accessed without difficulty with a needle under ultrasound guidance without difficulty on each side and permanent images were recorded.  We then placed 2 proglide devices on each side in a pre-close fashion and placed 8 French sheaths.   The patient was then given 5000 units of intravenous heparin.    The Pigtail catheter was placed into the aorta from the left side. Image was then obtained demonstrating the left iliac bifurcation. Stiff Amplatz wire was then advanced up both the right and left sides. 16 French sheath was then advanced up the left side and the 8 French sheath was exchanged for a 12 French sheath up the right side. Subsequently a Glidewire was advanced up the left side and snared by Dr. Wyn Wolfe from the right side and pulled extracorporeally. Using this wire which now is traveling from the left groin over the aortic bifurcation to the right groin the 12 French sheath is advanced and positioned just proximal to the left iliac bifurcation. The iliac bifurcation device was then prepped on the back table and advanced through the 16 French sheath and positioned above the left iliac bifurcation. The proximal portion was then opened and the  right groin sheath was advanced into the main portion of the iliac bifurcation device. Dr. Wyn Wolfe then used a KMP catheter and a Glidewire the wire catheter combination was negotiate the left internal iliac artery. Catheter was then introduced down into the secondary branches hand injection contrast was used to localize the first major bifurcation as well as the distal anatomy.  This represents 3rd order catheter placement. Subsequently,  an Amplatz Super Stiff wire was advanced through the catheter and the catheter removed. Next, the 14 mm diameter by 7 cm length hypogastric tender stent was deployed bridging the iliac bifurcation device with the left internal iliac device.   This was deployed just above the primary bifurcation of the left internal iliac artery keeping  both of the primary branches open.  Angioplasty balloon was then advanced up both the right and left side and simultaneously inflated using a Coda balloon in the left external iliac and a 14 mm balloon in the left internal iliac. Once this had been achieved the crossing Glidewire was removed the right sided sheath was pulled back into the right common iliac.   The pigtail catheter was then replaced and a completion angiogram was performed.   No Endoleak was detected on completion angiography.  The aneurysm was successfully excluded with no flow in the aneurysm sac.  There was brisk flow in both the left internal iliac and left external iliac artery.   At this point we elected to terminate the procedure. We secured the pro glide devices for hemostasis on the femoral arteries. The skin incision was closed with a 4-0 Monocryl. Dermabond and pressure dressing were placed. The patient was taken to the recovery room in stable condition having tolerated the procedure well.   COMPLICATIONS: none   CONDITION: stable   [facility] Office: [phone]   12/08/2019, 10:01 AM

## 2019-12-08 NOTE — Anesthesia Procedure Notes (Signed)
Procedure Name: Intubation Performed by: Ginger Carne, CRNA Pre-anesthesia Checklist: Patient identified, Patient being monitored, Timeout performed, Emergency Drugs available and Suction available Patient Re-evaluated:Patient Re-evaluated prior to induction Oxygen Delivery Method: Circle system utilized Preoxygenation: Pre-oxygenation with 100% oxygen Induction Type: IV induction Ventilation: Mask ventilation without difficulty and Oral airway inserted - appropriate to patient size Laryngoscope Size: 3 and McGraph Grade View: Grade II Tube type: Oral Tube size: 7.0 mm Number of attempts: 1 Airway Equipment and Method: Stylet Placement Confirmation: ETT inserted through vocal cords under direct vision,  positive ETCO2 and breath sounds checked- equal and bilateral Secured at: 21 cm Tube secured with: Tape Dental Injury: Teeth and Oropharynx as per pre-operative assessment

## 2019-12-08 NOTE — H&P (Signed)
[facility] Admission History & Physical  MRN : [id number]  Richard Wolfe is a 69 y.o. (May 26, 1951) male who presents with chief complaint of No chief complaint on file. Marland Kitchen  History of Present Illness:   The patient presents to [facility] today for repair of his iliac artery aneurysm. The aneurysm was found incidentally by CT scan done for abdominal pain. Today the patient denies abdominal pain or unusual back pain, no other abdominal complaints.  No history of an acute onset of painful blue discoloration of the toes.     There have been no interval changes in his medical status.  No family history of AAA.   Patient denies amaurosis fugax or TIA symptoms. There is no history of claudication or rest pain symptoms of the lower extremities.  The patient denies angina or shortness of breath.  CT scan shows an isolated left common iliac artery aneurysm that is 3.1 cm in size the right common iliac is just greater than 2 cm.    Current Facility-Administered Medications  Medication Dose Route Frequency Provider Last Rate Last Admin  . Chlorhexidine Gluconate Cloth 2 % PADS 6 each  6 each Topical Once Georgiana Spinner, NP       And  . Chlorhexidine Gluconate Cloth 2 % PADS 6 each  6 each Topical Once Georgiana Spinner, NP      . clindamycin (CLEOCIN) 300 MG/50ML IVPB           . clindamycin (CLEOCIN) IVPB 300 mg  300 mg Intravenous On Call to OR Georgiana Spinner, NP      . HYDROmorphone (DILAUDID) injection 1 mg  1 mg Intravenous Once PRN Georgiana Spinner, NP      . lactated ringers infusion   Intravenous Continuous Karleen Hampshire, MD 75 mL/hr at 12/08/19 0731 New Bag at 12/08/19 0731  . ondansetron (ZOFRAN) injection 4 mg  4 mg Intravenous Q6H PRN Georgiana Spinner, NP        Past Medical History:  Diagnosis Date  . Aortic regurgitation   . Arthritis   . Asthma   . Hernia, inguinal, recurrent   . Nasal polyps   . Pneumonia   . PONV (postoperative nausea and  vomiting)     Past Surgical History:  Procedure Laterality Date  . CATARACT EXTRACTION W/ INTRAOCULAR LENS IMPLANT Left 10/19/2008  . CATARACT EXTRACTION W/ INTRAOCULAR LENS IMPLANT Right 11/09/2008  . EYE SURGERY    . INGUINAL HERNIA REPAIR Left    1999  . INGUINAL HERNIA REPAIR Right    2019  . NASAL POLYP EXCISION     1981, 1986, 1993, 1994, 1995, 1996, 1998, 1999, 2001  . NASAL SEPTUM SURGERY     1981  . REFRACTIVE SURGERY  2008  . trigeminal nerve block  2003  . TRIGEMINAL NERVE DECOMPRESSION  2002, 2003  . WISDOM TOOTH EXTRACTION  1967  . XI ROBOTIC ASSISTED INGUINAL HERNIA REPAIR WITH MESH Left 2019    Social History Social History   Tobacco Use  . Smoking status: Never Smoker  . Smokeless tobacco: Never Used  Substance Use Topics  . Alcohol use: Yes    Alcohol/week: 2.0 standard drinks    Types: 1 Glasses of wine, 1 Cans of beer per week  . Drug use: Never    Family History Family History  Problem Relation Age of Onset  . Rheum arthritis Mother   . Cancer Father  liver  . Arthritis Brother   . Cancer Paternal Grandfather        throat  . Cancer Brother        liver  . Heart disease Half-Brother        Pacemaker/ICD  . Heart failure Half-Brother   No family history of bleeding/clotting disorders, porphyria or autoimmune disease   Allergies  Allergen Reactions  . Sulfur Anaphylaxis  . Penicillins     Convulsions Did it involve swelling of the face/tongue/throat, SOB, or low BP? No Did it involve sudden or severe rash/hives, skin peeling, or any reaction on the inside of your mouth or nose? No Did you need to seek medical attention at a hospital or doctor's office? Yes When did it last happen? If all above answers are "NO", may proceed with cephalosporin use.      REVIEW OF SYSTEMS (Negative unless checked)  Constitutional: [] Weight loss  [] Fever  [] Chills Cardiac: [] Chest pain   [] Chest pressure   [] Palpitations   [] Shortness of  breath when laying flat   [] Shortness of breath at rest   [] Shortness of breath with exertion. Vascular:  [] Pain in legs with walking   [] Pain in legs at rest   [] Pain in legs when laying flat   [] Claudication   [] Pain in feet when walking  [] Pain in feet at rest  [] Pain in feet when laying flat   [] History of DVT   [] Phlebitis   [] Swelling in legs   [] Varicose veins   [] Non-healing ulcers Pulmonary:   [] Uses home oxygen   [] Productive cough   [] Hemoptysis   [] Wheeze  [] COPD   [] Asthma Neurologic:  [] Dizziness  [] Blackouts   [] Seizures   [] History of stroke   [] History of TIA  [] Aphasia   [] Temporary blindness   [] Dysphagia   [] Weakness or numbness in arms   [] Weakness or numbness in legs Musculoskeletal:  [] Arthritis   [] Joint swelling   [] Joint pain   [] Low back pain Hematologic:  [] Easy bruising  [] Easy bleeding   [] Hypercoagulable state   [] Anemic  [] Hepatitis Gastrointestinal:  [] Blood in stool   [] Vomiting blood  [] Gastroesophageal reflux/heartburn   [] Difficulty swallowing. Genitourinary:  [] Chronic kidney disease   [] Difficult urination  [] Frequent urination  [] Burning with urination   [] Blood in urine Skin:  [] Rashes   [] Ulcers   [] Wounds Psychological:  [] History of anxiety   []  History of major depression.  Physical Examination  Vitals:   12/08/19 0703  BP: (!) 155/91  Pulse: 75  Resp: 16  Temp: 98.4 F (36.9 C)  TempSrc: Oral  SpO2: 97%  Weight: 84.4 kg  Height: 5\' 11"  (1.803 m)   Body mass index is 25.94 kg/m. Gen: WD/WN, NAD Head: [place]/AT, No temporalis wasting.  Ear/Nose/Throat: Hearing grossly intact, nares w/o erythema or drainage, oropharynx w/o Erythema/Exudate, Eyes: Sclera non-icteric, conjunctiva clear Neck: Supple, no nuchal rigidity.  No JVD.  Pulmonary:  Good air movement, no increased work of respiration or use of accessory muscles  Cardiac: RRR, normal S1, S2, no Murmurs, rubs or gallops. Vascular: Vessel Right Left  Radial Palpable Palpable  PT Palpable  Palpable  DP Palpable Palpable  Gastrointestinal: soft, non-tender/non-distended. No guarding/reflex. No masses, surgical incisions, or scars. Musculoskeletal: M/S 5/5 throughout.  No deformity or atrophy.  trace edema Neurologic: Sensation grossly intact in extremities.  Symmetrical.  Speech is fluent. Motor exam as listed above. Psychiatric: Judgment intact, Mood & affect appropriate for pt's clinical situation. Dermatologic: No rashes or ulcers noted.  No cellulitis  or open wounds.   CBC Lab Results  Component Value Date   WBC 4.8 12/06/2019   HGB 14.9 12/06/2019   HCT 45.9 12/06/2019   MCV 95.4 12/06/2019   PLT 183 12/06/2019    BMET    Component Value Date/Time   NA 139 12/06/2019 1322   NA 140 08/09/2019 1425   K 4.0 12/06/2019 1322   CL 104 12/06/2019 1322   CO2 28 12/06/2019 1322   GLUCOSE 102 (H) 12/06/2019 1322   BUN 17 12/06/2019 1322   BUN 23 08/09/2019 1425   CREATININE 0.95 12/06/2019 1322   CALCIUM 9.2 12/06/2019 1322   GFRNONAA >60 12/06/2019 1322   GFRAA >60 12/06/2019 1322   Estimated Creatinine Clearance: 77.1 mL/min (by C-G formula based on SCr of 0.95 mg/dL).  COAG Lab Results  Component Value Date   INR 1.1 12/06/2019    Radiology No results found.    Assessment/Plan 1. Iliac artery aneurysm (HCC) Recommend: The iliac artery aneurysm is >3 cm and therefore should undergo repair. Patient is status post CT scan of the abdominal aorta. The patient is a candidate for endovascular repair.   He has cardiac clearance from Dr End.   The patient will continue antiplatelet therapy as prescribed (since the patient is undergoing endovascular repair as opposed to open repair) as well as aggressive management of hyperlipidemia. Exercise is again strongly encouraged.   The patient is reminded that lifetime routine surveillance is a necessity with an endograft.   The risks and benefits of endovascular aneurysm repair are reviewed with the patient.   All questions are answered.  Alternative therapies are also discussed.  The patient agrees to proceed with endovascular aneurysm repair.  Patient will follow-up with me in the office after the surgery.  2. Peripheral venous insufficiency No surgery or intervention at this point in time.    I have had a long discussion with the patient regarding venous insufficiency and why it  causes symptoms. I have discussed with the patient the chronic skin changes that accompany venous insufficiency and the long term sequela such as infection and ulceration.  Patient will begin wearing graduated compression stockings class 1 (20-30 mmHg) or compression wraps on a daily basis a prescription was given. The patient will put the stockings on first thing in the morning and removing them in the evening. The patient is instructed specifically not to sleep in the stockings.    In addition, behavioral modification including several periods of elevation of the lower extremities during the day will be continued. I have demonstrated that proper elevation is a position with the ankles at heart level.  The patient is instructed to begin routine exercise, especially walking on a daily basis  The patient will follow up reassess the degree of swelling and the control that graduated compression stockings or compression wraps  is offering.   The patient can be assessed for a Lymph Pump at that time  3. Mild intermittent asthma, uncomplicated Continue pulmonary medications and aerosols as already ordered, these medications have been reviewed and there are no changes at this time.    4. Primary osteoarthritis of both knees Continue NSAID medications as already ordered, these medications have been reviewed and there are no changes at this time.  Continued activity and therapy was stressed.   Hortencia Pilar, MD  12/08/2019 7:37 AM

## 2019-12-08 NOTE — Transfer of Care (Signed)
Immediate Anesthesia Transfer of Care Note  Patient: Richard Wolfe  Procedure(s) Performed: ENDOVASCULAR REPAIR/STENT GRAFT (N/A )  Patient Location: PACU  Anesthesia Type:General  Level of Consciousness: awake, alert  and oriented  Airway & Oxygen Therapy: Patient Spontanous Breathing and Patient connected to face mask oxygen  Post-op Assessment: Report given to RN and Post -op Vital signs reviewed and stable  Post vital signs: Reviewed and stable  Last Vitals:  Vitals Value Taken Time  BP 138/70 12/08/19 1010  Temp 36.8 C 12/08/19 1010  Pulse 63 12/08/19 1011  Resp 13 12/08/19 1011  SpO2 98 % 12/08/19 1011  Vitals shown include unvalidated device data.  Last Pain:  Vitals:   12/08/19 0703  TempSrc: Oral  PainSc: 0-No pain         Complications: No apparent anesthesia complications

## 2019-12-08 NOTE — Op Note (Signed)
OPERATIVE NOTE     PROCEDURE: 1. US guidance for vascular access, bilateral femoral arteries 2. Catheter placement into right common femoral from the left common femoral approach  3. Catheter placement into tertiary branches of the left internal iliac artery from the right femoral approach 4.   Placement of a 23 x 14 x 10 C3 Gore Excluder bifurcated iliac segment left common iliac artery which is extended into the left external iliac artery 4. Placement of a 16 x 14 Gore internal iliac extender limb into the left hypogastric artery 5. Pro glide closures utilized in a pre-close fashion bilateral common femoral arteries   PRE-OPERATIVE DIAGNOSIS: AAA   POST-OPERATIVE DIAGNOSIS: same   SURGEON: Levora Dredge, MD and Festus Barren, MD - Co-surgeons   ANESTHESIA: general   ESTIMATED BLOOD LOSS: 150 cc   FINDING(S): 1.  AAA with bilateral common iliac artery aneurysm   SPECIMEN(S):  none   INDICATIONS:   Richard Wolfe is a 69 y.o. y.o. male who presents with an abdominal aortic aneurysm that is now greater than 5 cm and is associated with bilateral common iliac artery aneurysms. The patient will require endovascular repair to prevent lethal rupture.   DESCRIPTION: After obtaining full informed written consent, the patient was brought back to the operating room and placed supine upon the operating table.  The patient received IV antibiotics prior to induction.  After obtaining adequate anesthesia, the patient was prepped and draped in the standard fashion for endovascular AAA repair.  Co-surgeons are required because this is a complex bilateral procedure with work being performed simultaneously from both the right femoral and left femoral approach.  This also expedites the procedure making a shorter operative time reducing complications and improving patient safety.  We then began by gaining access to both femoral arteries with US guidance with me working on the patient's left and Dr. Wyn Quaker  working on the patient's right.  The femoral arteries were found to be patent and accessed without difficulty with a needle under ultrasound guidance without difficulty on each side and permanent images were recorded.  We then placed 2 proglide devices on each side in a pre-close fashion and placed 8 French sheaths.   The patient was then given   5000 units of intravenous heparin.    The Pigtail catheter was placed into the aorta from the   left side. Image was then obtained demonstrating the left iliac bifurcation. Stiff Amplatz wire was then advanced up both the right and left sides. 16 French sheath was then advanced up the left side and the 8 French sheath was exchanged for a 12 French sheath up the right side. Subsequently a catheter and Glidewire was advanced up the left side and snared by Dr. Wyn Quaker right common femoral and pulled extracorporeally. Using this wire which now is traveling from the left groin over the aortic bifurcation to the right groin the 12 French sheath is advanced and positioned just proximal to the left iliac bifurcation. The 23 x 14 x 10 iliac bifurcation device was then prepped on the back table and advanced through the 16 French sheath and positioned above the left iliac bifurcation. The proximal portion was then opened and the right groin sheath was advanced into the main portion of the iliac bifurcation device. Dr. Wyn Quaker then used a KMP catheter and a Glidewire the wire catheter combination was negotiate the left internal iliac artery and subsequently the wire and catheter were negotiated to the tertiary branches.  KMP catheter was  then introduced down into the tertiary branches hand injection contrast was used to localize the first major bifurcation as well as the distal anatomy.  This represents 3rd order catheter placement. Subsequently, an Amplatz Super Stiff wire was advanced through the catheter and the catheter removed. Next, the 16 x 14 internal iliac extender limb was  deployed bridging the iliac bifurcation device with the left internal iliac device. Angioplasty balloon was then advanced up both the right and left side and simultaneously inflated using a Coda balloon in the left external iliac and a 14 mm balloon in the left internal iliac. Once this had been achieved the crossing Glidewire  was removed and angioplasty with a compliant balloon was completed proximally and distally.  The pigtail catheter was then advanced up the left side and positioned in the distal aorta. Imaging of the iliac bifurcation device was then performed with a bolus injection of contrast.  No endoleak's were noted.  Patency of the internal as well as external iliac artery on the left was verified.     At this point we elected to terminate the procedure. We secured the pro glide devices for hemostasis on the femoral arteries. The skin incision was closed with a 4-0 Monocryl. Dermabond and pressure dressing were placed. The patient was taken to the recovery room in stable condition having tolerated the procedure well.   COMPLICATIONS: none   CONDITION: stable   Hortencia Pilar [facility] Office: [phone]   12/08/2019, 10:18 AM

## 2019-12-09 DIAGNOSIS — I723 Aneurysm of iliac artery: Secondary | ICD-10-CM | POA: Diagnosis not present

## 2019-12-09 LAB — CBC
HCT: 38.1 % — ABNORMAL LOW (ref 39.0–52.0)
Hemoglobin: 13.3 g/dL (ref 13.0–17.0)
MCH: 32.3 pg (ref 26.0–34.0)
MCHC: 34.9 g/dL (ref 30.0–36.0)
MCV: 92.5 fL (ref 80.0–100.0)
Platelets: 162 10*3/uL (ref 150–400)
RBC: 4.12 MIL/uL — ABNORMAL LOW (ref 4.22–5.81)
RDW: 13.5 % (ref 11.5–15.5)
WBC: 10.2 10*3/uL (ref 4.0–10.5)
nRBC: 0 % (ref 0.0–0.2)

## 2019-12-09 LAB — BASIC METABOLIC PANEL
Anion gap: 8 (ref 5–15)
BUN: 13 mg/dL (ref 8–23)
CO2: 25 mmol/L (ref 22–32)
Calcium: 8.7 mg/dL — ABNORMAL LOW (ref 8.9–10.3)
Chloride: 105 mmol/L (ref 98–111)
Creatinine, Ser: 0.77 mg/dL (ref 0.61–1.24)
GFR calc Af Amer: >60 mL/min (ref >60)
GFR calc non Af Amer: >60 mL/min (ref >60)
Glucose, Bld: 117 mg/dL — ABNORMAL HIGH (ref 70–99)
Potassium: 4 mmol/L (ref 3.5–5.1)
Sodium: 138 mmol/L (ref 135–145)

## 2019-12-09 LAB — MAGNESIUM: Magnesium: 2.1 mg/dL (ref 1.7–2.4)

## 2019-12-09 MED ORDER — CLOPIDOGREL BISULFATE 75 MG PO TABS: 75.0000 mg | ORAL_TABLET | Freq: Every day | ORAL | 1 refills | Status: DC

## 2019-12-09 MED ORDER — ASPIRIN 81 MG PO TBEC: 81.0000 mg | DELAYED_RELEASE_TABLET | Freq: Every day | ORAL | 3 refills | Status: DC

## 2019-12-09 MED ORDER — OXYCODONE-ACETAMINOPHEN 5-325 MG PO TABS: 1.0000 | ORAL_TABLET | Freq: Four times a day (QID) | ORAL | 0 refills | Status: DC | PRN

## 2019-12-09 NOTE — Plan of Care (Signed)
Patient eager to discharge today.

## 2019-12-09 NOTE — Discharge Summary (Signed)
[facility]    Discharge Summary  Patient ID:  Richard Wolfe MRN: [id number] DOB/AGE: 10-09-1950 69 y.o.  Admit date: 12/08/2019 Discharge date: 12/09/2019 Date of Surgery: 12/08/2019 Surgeon: Surgeon(s): Schnier, Dolores Lory, MD Algernon Huxley, MD  Admission Diagnosis: Iliac artery aneurysm, left [facility]) [I72.3]  Discharge Diagnoses:  Iliac artery aneurysm, left [facility]) [I72.3]  Secondary Diagnoses: Past Medical History:  Diagnosis Date  . Aortic regurgitation   . Arthritis   . Asthma   . Hernia, inguinal, recurrent   . Nasal polyps   . Pneumonia   . PONV (postoperative nausea and vomiting)    Procedure(s): 12/08/19: 1. US guidance for vascular access, bilateral femoral arteries 2. Catheter placement into right common femoral from the left common femoral approach  3. Catheter placement into tertiary branches of the left internal iliac artery from the right femoral approach 4.   Placement of a 23 x 14 x 10 C3 Gore Excluder bifurcated iliac segment left common iliac artery which is extended into the left external iliac artery 4. Placement of a 16 x 14 Gore internal iliac extender limb into the left hypogastric artery 5. Pro glide closures utilized in a pre-close fashion bilateral common femoral arteries  Discharged Condition: Good  HPI / Hospital Course:  Richard Wolfe is a 69 y.o.y.o. malewho presents with an abdominal aortic aneurysm that is now greater than 5 cm and is associated with bilateral common iliac artery aneurysms. The patient will require endovascular repair to prevent lethal rupture. On 12/08/19, The patient underwent:  4. US guidance for vascular access, bilateral femoral arteries 5. Catheter placement into right common femoral from the left common femoral approach  6. Catheter placement into tertiary branches of the left internal iliac artery from the right femoral approach 4.   Placement of a 23 x 14 x 10 C3 Gore Excluder  bifurcated iliac segment left common iliac artery which is extended into the left external iliac artery 6. Placement of a 16 x 14 Gore internal iliac extender limb into the left hypogastric artery 7. Pro glide closures utilized in a pre-close fashion bilateral common femoral arteries  The patient tolerated the procedure well and was transferred to the recovery room to the ICU for observation overnight.  The patient's night of surgery was unremarkable.  The day of discharge / POD#1, the patient was tolerating regular diet, his pain was controlled with the use of p.o. pain medication, he was urinating independently and ambulating at baseline.  At discharge, the patient was afebrile with stable vital signs and unremarkable physical exam.  Extubated: POD # 0  Physical exam:  A&Ox3, NAD CV: RRR Pulm: CTA Bilaterally Abdomen: Soft, Non-tender, Non-distended, (+) Bowel Sounds Right Groin:   Access site: Clean dry intact.  Ecchymosis noted around site Left Groin:   Access site: Clean dry intact.  Ecchymosis noted around site Vascular:   Bilateral lower extremity: Warm.  Soft.  Minimal edema  Labs as below  Complications: None  Consults: None  Significant Diagnostic Studies: CBC Lab Results  Component Value Date   WBC 10.2 12/09/2019   HGB 13.3 12/09/2019   HCT 38.1 (L) 12/09/2019   MCV 92.5 12/09/2019   PLT 162 12/09/2019   BMET    Component Value Date/Time   NA 138 12/09/2019 0308   NA 140 08/09/2019 1425   K 4.0 12/09/2019 0308   CL 105 12/09/2019 0308   CO2 25 12/09/2019 0308   GLUCOSE 117 (H) 12/09/2019 0308  BUN 13 12/09/2019 0308   BUN 23 08/09/2019 1425   CREATININE 0.77 12/09/2019 0308   CALCIUM 8.7 (L) 12/09/2019 0308   GFRNONAA >60 12/09/2019 0308   GFRAA >60 12/09/2019 0308   COAG Lab Results  Component Value Date   INR 1.1 12/06/2019   Disposition:  Discharge to :Home  Allergies as of 12/09/2019      Reactions   Sulfur Anaphylaxis   Penicillins     Convulsions Did it involve swelling of the face/tongue/throat, SOB, or low BP? No Did it involve sudden or severe rash/hives, skin peeling, or any reaction on the inside of your mouth or nose? No Did you need to seek medical attention at a hospital or doctor's office? Yes When did it last happen? If all above answers are "NO", may proceed with cephalosporin use.      Medication List    TAKE these medications   aspirin 81 MG EC tablet Take 1 tablet (81 mg total) by mouth daily at 6 (six) AM. Start taking on: December 10, 2019   budesonide 0.5 MG/2ML nebulizer solution Commonly known as: PULMICORT Take 0.5 mg by nebulization 2 (two) times daily.   clopidogrel 75 MG tablet Commonly known as: PLAVIX Take 1 tablet (75 mg total) by mouth daily at 6 (six) AM. Start taking on: December 10, 2019   cyanocobalamin 1000 MCG tablet Take 1,000 mcg by mouth daily.   doxycycline 20 MG tablet Commonly known as: PERIOSTAT Take 20 mg by mouth 2 (two) times daily.   FOLATE PO Take 400 mcg by mouth daily.   JOINT HEALTH PO Take 250 mg by mouth daily.   Magnesium 400 MG Caps Take 800 mg by mouth daily.   milk thistle 175 MG tablet Take 350 mg by mouth daily.   OMEGA-3 COMPLEX PO Take 1,280 mg by mouth daily.   oxyCODONE-acetaminophen 5-325 MG tablet Commonly known as: PERCOCET/ROXICET Take 1 tablet by mouth every 6 (six) hours as needed for moderate pain.   Symbicort 80-4.5 MCG/ACT inhaler Generic drug: budesonide-formoterol Inhale 1 puff into the lungs 2 (two) times daily.   TURMERIC PO Take 2,000 mg by mouth daily.   vitamin C 1000 MG tablet Take 1,000 mg by mouth daily.   Vitamin D3 125 MCG (5000 UT) Caps Take 5,000 Units by mouth daily.      Verbal and written Discharge instructions given to the patient. Wound care per Discharge AVS Follow-up Information    Georgiana Spinner, NP Follow up in 1 week(s).   Specialty: Vascular Surgery Why: First post-op. No studies  needed. Incision check.  Contact information: [address] [place] [address] [phone]        Georgiana Spinner, NP In 1 week.   Specialty: Vascular Surgery Why: APPT THURSDAY. December 16, 2019 WITH NP [facility] Contact information: [address] [place] [address] [phone]          Signed: Tonette Lederer, PA-C  12/09/2019, 11:07 AM

## 2019-12-09 NOTE — Anesthesia Postprocedure Evaluation (Signed)
Anesthesia Post Note  Patient: Richard Wolfe  Procedure(s) Performed: ENDOVASCULAR REPAIR/STENT GRAFT (N/A )  Patient location during evaluation: ICU Anesthesia Type: General Level of consciousness: awake, awake and alert, oriented and patient cooperative Pain management: pain level controlled Vital Signs Assessment: post-procedure vital signs reviewed and stable Respiratory status: spontaneous breathing, respiratory function stable and nonlabored ventilation Cardiovascular status: stable Anesthetic complications: no     Last Vitals:  Vitals:   12/09/19 0500 12/09/19 0600  BP: (!) 124/59 124/70  Pulse: 72 64  Resp: 12 18  Temp:    SpO2: 97% 94%    Last Pain:  Vitals:   12/09/19 0400  TempSrc: Oral  PainSc:                  Lyn Records

## 2019-12-09 NOTE — Discharge Instructions (Signed)
Vascular Surgery Discharge Instructions: 1) You may remove your dressings and shower as of tomorrow.  Gently clean your groins and pat dry. 2) No strenuous activity or lifting heavier than 10 pounds for 2 weeks. 3) No driving while on pain medicine or for at least 2 weeks.

## 2019-12-10 ENCOUNTER — Telehealth (INDEPENDENT_AMBULATORY_CARE_PROVIDER_SITE_OTHER): Payer: Self-pay

## 2019-12-10 NOTE — Telephone Encounter (Signed)
Home health nurse Morganleft a voicemail informing that she called the patient to schedule the start of care but the patient decline stating that he is going great ,having no pain,and the incision healing good. The home health nurse wanted to make our office aware.

## 2019-12-14 ENCOUNTER — Encounter: Payer: Self-pay | Admitting: Pulmonary Disease

## 2019-12-16 ENCOUNTER — Other Ambulatory Visit: Payer: Self-pay

## 2019-12-16 ENCOUNTER — Ambulatory Visit (INDEPENDENT_AMBULATORY_CARE_PROVIDER_SITE_OTHER): Payer: Medicare Other | Admitting: Nurse Practitioner

## 2019-12-16 ENCOUNTER — Encounter (INDEPENDENT_AMBULATORY_CARE_PROVIDER_SITE_OTHER): Payer: Self-pay | Admitting: Nurse Practitioner

## 2019-12-16 VITALS — BP 149/74 | HR 81 | Resp 16 | Ht 71.0 in | Wt 191.0 lb

## 2019-12-16 DIAGNOSIS — M170 Bilateral primary osteoarthritis of knee: Secondary | ICD-10-CM

## 2019-12-16 DIAGNOSIS — I723 Aneurysm of iliac artery: Secondary | ICD-10-CM

## 2019-12-22 ENCOUNTER — Encounter (INDEPENDENT_AMBULATORY_CARE_PROVIDER_SITE_OTHER): Payer: Self-pay | Admitting: Nurse Practitioner

## 2019-12-22 NOTE — Progress Notes (Signed)
Subjective:    Patient ID: Richard Wolfe, male    DOB: 01-17-1951, 69 y.o.   MRN: [id number] Chief Complaint  Patient presents with  . Follow-up    1 wk armc first post op incision check     Patient presents today for a 1 week postop incision check.  Patient reports that he has had very little pain discomfort following his procedure.  He states that he is doing very well overall.  He states that his biggest complaint was bruising which has greatly subsided at this time.  He denies any fever, chills, nausea, vomiting or diarrhea.  He denies any claudication or rest pain like symptoms.  He denies any symptoms of distal embolization.  The patient underwent:  PROCEDURE: 1. US guidance for vascular access, bilateral femoral arteries 2. Catheter placement into right common femoral from the left common femoral approach  3. Catheter placement into tertiary branches of the left internal iliac artery from the right femoral approach 4.   Placement of a 23 x 14 x 10 C3 Gore Excluder bifurcated iliac segment left common iliac artery which is extended into the left external iliac artery 4. Placement of a 16 x 14 Gore internal iliac extender limb into the left hypogastric artery 5. Pro glide closures utilized in a pre-close fashion bilateral common femoral arteries  Patient has continued to take his aspirin and Plavix without issue.  Bilateral groin puncture sites look well.   Review of Systems  Skin: Positive for wound.  All other systems reviewed and are negative.      Objective:   Physical Exam Vitals reviewed.  Constitutional:      Appearance: Normal appearance.  Cardiovascular:     Rate and Rhythm: Normal rate and regular rhythm.     Pulses: Normal pulses.     Heart sounds: Normal heart sounds.  Pulmonary:     Effort: Pulmonary effort is normal.     Breath sounds: Normal breath sounds.  Skin:    General: Skin is warm.     Findings: Bruising present.     Comments: Groin  bruising  Neurological:     Mental Status: He is alert and oriented to person, place, and time.  Psychiatric:        Mood and Affect: Mood normal.        Behavior: Behavior normal.        Thought Content: Thought content normal.        Judgment: Judgment normal.     BP (!) 149/74 (BP Location: Left Arm)   Pulse 81   Resp 16   Ht 5\' 11"  (1.803 m)   Wt 191 lb (86.6 kg)   BMI 26.64 kg/m   Past Medical History:  Diagnosis Date  . Aortic regurgitation   . Arthritis   . Asthma   . Hernia, inguinal, recurrent   . Nasal polyps   . Pneumonia   . PONV (postoperative nausea and vomiting)     Social History   Socioeconomic History  . Marital status: Married    Spouse name: Not on file  . Number of children: Not on file  . Years of education: Not on file  . Highest education level: Not on file  Occupational History  . Not on file  Tobacco Use  . Smoking status: Never Smoker  . Smokeless tobacco: Never Used  Substance and Sexual Activity  . Alcohol use: Yes    Alcohol/week: 2.0 standard drinks    Types: 1 Glasses  of wine, 1 Cans of beer per week  . Drug use: Never  . Sexual activity: Yes  Other Topics Concern  . Not on file  Social History Narrative  . Not on file   Social Determinants of Health   Financial Resource Strain: Low Risk   . Difficulty of Paying Living Expenses: Not hard at all  Food Insecurity: No Food Insecurity  . Worried About Charity fundraiser in the Last Year: Never true  . Ran Out of Food in the Last Year: Never true  Transportation Needs: No Transportation Needs  . Lack of Transportation (Medical): No  . Lack of Transportation (Non-Medical): No  Physical Activity: Sufficiently Active  . Days of Exercise per Week: 5 days  . Minutes of Exercise per Session: 30 min  Stress: No Stress Concern Present  . Feeling of Stress : Not at all  Social Connections: Somewhat Isolated  . Frequency of Communication with Friends and Family: More than three  times a week  . Frequency of Social Gatherings with Friends and Family: More than three times a week  . Attends Religious Services: Never  . Active Member of Clubs or Organizations: No  . Attends Archivist Meetings: Never  . Marital Status: Married  Human resources officer Violence: Not At Risk  . Fear of Current or Ex-Partner: No  . Emotionally Abused: No  . Physically Abused: No  . Sexually Abused: No    Past Surgical History:  Procedure Laterality Date  . CATARACT EXTRACTION W/ INTRAOCULAR LENS IMPLANT Left 10/19/2008  . CATARACT EXTRACTION W/ INTRAOCULAR LENS IMPLANT Right 11/09/2008  . ENDOVASCULAR REPAIR/STENT GRAFT N/A 12/08/2019   Procedure: ENDOVASCULAR REPAIR/STENT GRAFT;  Surgeon: Katha Cabal, MD;  Location: [facility];  Service: Cardiovascular;  Laterality: N/A;  . EYE SURGERY    . INGUINAL HERNIA REPAIR Left    1999  . INGUINAL HERNIA REPAIR Right    2019  . [place], [place], [place], 1998, 1999, 2001  . NASAL SEPTUM SURGERY     1981  . REFRACTIVE SURGERY  2008  . trigeminal nerve block  2003  . TRIGEMINAL NERVE DECOMPRESSION  2002, 2003  . [place]  . XI ROBOTIC ASSISTED INGUINAL HERNIA REPAIR WITH MESH Left 2019    Family History  Problem Relation Age of Onset  . Rheum arthritis Mother   . Cancer Father        liver  . Arthritis Brother   . Cancer Paternal Grandfather        throat  . Cancer Brother        liver  . Heart disease Half-Brother        Pacemaker/ICD  . Heart failure Half-Brother     Allergies  Allergen Reactions  . Sulfur Anaphylaxis  . Penicillins     Convulsions Did it involve swelling of the face/tongue/throat, SOB, or low BP? No Did it involve sudden or severe rash/hives, skin peeling, or any reaction on the inside of your mouth or nose? No Did you need to seek medical attention at a hospital or doctor's office? Yes When did it last happen? If all  above answers are "NO", may proceed with cephalosporin use.        Assessment & Plan:   1. Iliac artery aneurysm, left [facility]) Patient doing well post procedure.  Very little pain discomfort.  Patient advised that most every day activities patient still  refrain from extremity heavy lifting.  Patient should continue to ambulate on a daily basis.  Patient will continue with aspirin and Plavix.  Patient will follow up in 6 to 8 weeks for noninvasive imaging.  2. Primary osteoarthritis of both knees Continue NSAID medications as already ordered, these medications have been reviewed and there are no changes at this time.  Continued activity and therapy was stressed.    Current Outpatient Medications on File Prior to Visit  Medication Sig Dispense Refill  . Ascorbic Acid (VITAMIN C) 1000 MG tablet Take 1,000 mg by mouth daily.    Marland Kitchen aspirin EC 81 MG EC tablet Take 1 tablet (81 mg total) by mouth daily at 6 (six) AM. 90 tablet 3  . budesonide (PULMICORT) 0.5 MG/2ML nebulizer solution Take 0.5 mg by nebulization 2 (two) times daily.     . Cholecalciferol (VITAMIN D3) 125 MCG (5000 UT) CAPS Take 5,000 Units by mouth daily.     . clopidogrel (PLAVIX) 75 MG tablet Take 1 tablet (75 mg total) by mouth daily at 6 (six) AM. 90 tablet 1  . cyanocobalamin 1000 MCG tablet Take 1,000 mcg by mouth daily.     . DHA-EPA-Vitamin E (OMEGA-3 COMPLEX PO) Take 1,280 mg by mouth daily.    Marland Kitchen doxycycline (PERIOSTAT) 20 MG tablet Take 20 mg by mouth 2 (two) times daily.    . Folic Acid (FOLATE PO) Take 400 mcg by mouth daily.    . Magnesium 400 MG CAPS Take 800 mg by mouth daily.     . milk thistle 175 MG tablet Take 350 mg by mouth daily.    . Misc Natural Products (JOINT HEALTH PO) Take 250 mg by mouth daily.    Marland Kitchen oxyCODONE-acetaminophen (PERCOCET/ROXICET) 5-325 MG tablet Take 1 tablet by mouth every 6 (six) hours as needed for moderate pain. 15 tablet 0  . SYMBICORT 80-4.5 MCG/ACT inhaler Inhale 1 puff into the  lungs 2 (two) times daily.    . TURMERIC PO Take 2,000 mg by mouth daily.      No current facility-administered medications on file prior to visit.    There are no Patient Instructions on file for this visit. No follow-ups on file.   Georgiana Spinner, NP

## 2020-01-03 ENCOUNTER — Ambulatory Visit: Payer: Medicare Other | Admitting: Nurse Practitioner

## 2020-01-03 ENCOUNTER — Other Ambulatory Visit: Payer: Self-pay

## 2020-01-03 ENCOUNTER — Ambulatory Visit (INDEPENDENT_AMBULATORY_CARE_PROVIDER_SITE_OTHER): Payer: Medicare Other | Admitting: Family Medicine

## 2020-01-03 ENCOUNTER — Encounter: Payer: Self-pay | Admitting: Family Medicine

## 2020-01-03 VITALS — BP 143/78 | HR 82 | Temp 98.8°F | Ht 71.0 in | Wt 190.8 lb

## 2020-01-03 DIAGNOSIS — Z131 Encounter for screening for diabetes mellitus: Secondary | ICD-10-CM | POA: Diagnosis not present

## 2020-01-03 DIAGNOSIS — Z0000 Encounter for general adult medical examination without abnormal findings: Secondary | ICD-10-CM

## 2020-01-03 DIAGNOSIS — Z7189 Other specified counseling: Secondary | ICD-10-CM | POA: Insufficient documentation

## 2020-01-03 DIAGNOSIS — E559 Vitamin D deficiency, unspecified: Secondary | ICD-10-CM

## 2020-01-03 DIAGNOSIS — E538 Deficiency of other specified B group vitamins: Secondary | ICD-10-CM | POA: Diagnosis not present

## 2020-01-03 DIAGNOSIS — Z136 Encounter for screening for cardiovascular disorders: Secondary | ICD-10-CM

## 2020-01-03 DIAGNOSIS — R5383 Other fatigue: Secondary | ICD-10-CM

## 2020-01-03 DIAGNOSIS — R3911 Hesitancy of micturition: Secondary | ICD-10-CM

## 2020-01-03 DIAGNOSIS — Z1159 Encounter for screening for other viral diseases: Secondary | ICD-10-CM

## 2020-01-03 DIAGNOSIS — R030 Elevated blood-pressure reading, without diagnosis of hypertension: Secondary | ICD-10-CM

## 2020-01-03 LAB — URINALYSIS, ROUTINE W REFLEX MICROSCOPIC
Bilirubin, UA: NEGATIVE
Glucose, UA: NEGATIVE
Leukocytes,UA: NEGATIVE
Nitrite, UA: NEGATIVE
Protein,UA: NEGATIVE
RBC, UA: NEGATIVE
Specific Gravity, UA: >1.03 — ABNORMAL HIGH (ref 1.005–1.030)
Urobilinogen, Ur: 0.2 mg/dL (ref 0.2–1.0)
pH, UA: 5 (ref 5.0–7.5)

## 2020-01-03 LAB — MICROALBUMIN, URINE WAIVED
Creatinine, Urine Waived: 200 mg/dL (ref 10–300)
Microalb, Ur Waived: 30 mg/L — ABNORMAL HIGH (ref 0–19)
Microalb/Creat Ratio: <30 mg/g (ref <30)

## 2020-01-03 LAB — BAYER DCA HB A1C WAIVED: HB A1C (BAYER DCA - WAIVED): 5.2 % (ref <7.0)

## 2020-01-03 NOTE — Patient Instructions (Addendum)
Preventative Services:  Health Risk Assessment and Personalized Prevention Plan: Done today Bone Mass Measurements: N/A CVD Screening: Done today Colon Cancer Screening: Declined Depression Screening: Done today Diabetes Screening: Done today Glaucoma Screening: See your eye doctor Hepatitis B vaccine: N/A Hepatitis C screening: Done today HIV Screening: N/A Flu Vaccine: Get in the fall Lung cancer Screening: N/A Obesity Screening: Done today Pneumonia Vaccines (2): Up to date STI Screening: N/A PSA screening: Done today   Health Maintenance After Age 65 After age 13, you are at a higher risk for certain long-term diseases and infections as well as injuries from falls. Falls are a major cause of broken bones and head injuries in people who are older than age 72. Getting regular preventive care can help to keep you healthy and well. Preventive care includes getting regular testing and making lifestyle changes as recommended by your health care provider. Talk with your health care provider about:  Which screenings and tests you should have. A screening is a test that checks for a disease when you have no symptoms.  A diet and exercise plan that is right for you. What should I know about screenings and tests to prevent falls? Screening and testing are the best ways to find a health problem early. Early diagnosis and treatment give you the best chance of managing medical conditions that are common after age 81. Certain conditions and lifestyle choices may make you more likely to have a fall. Your health care provider may recommend:  Regular vision checks. Poor vision and conditions such as cataracts can make you more likely to have a fall. If you wear glasses, make sure to get your prescription updated if your vision changes.  Medicine review. Work with your health care provider to regularly review all of the medicines you are taking, including over-the-counter medicines. Ask your health  care provider about any side effects that may make you more likely to have a fall. Tell your health care provider if any medicines that you take make you feel dizzy or sleepy.  Osteoporosis screening. Osteoporosis is a condition that causes the bones to get weaker. This can make the bones weak and cause them to break more easily.  Blood pressure screening. Blood pressure changes and medicines to control blood pressure can make you feel dizzy.  Strength and balance checks. Your health care provider may recommend certain tests to check your strength and balance while standing, walking, or changing positions.  Foot health exam. Foot pain and numbness, as well as not wearing proper footwear, can make you more likely to have a fall.  Depression screening. You may be more likely to have a fall if you have a fear of falling, feel emotionally low, or feel unable to do activities that you used to do.  Alcohol use screening. Using too much alcohol can affect your balance and may make you more likely to have a fall. What actions can I take to lower my risk of falls? General instructions  Talk with your health care provider about your risks for falling. Tell your health care provider if: ? You fall. Be sure to tell your health care provider about all falls, even ones that seem minor. ? You feel dizzy, sleepy, or off-balance.  Take over-the-counter and prescription medicines only as told by your health care provider. These include any supplements.  Eat a healthy diet and maintain a healthy weight. A healthy diet includes low-fat dairy products, low-fat (lean) meats, and fiber from whole grains,  beans, and lots of fruits and vegetables. Home safety  Remove any tripping hazards, such as rugs, cords, and clutter.  Install safety equipment such as grab bars in bathrooms and safety rails on stairs.  Keep rooms and walkways well-lit. Activity   Follow a regular exercise program to stay fit. This will  help you maintain your balance. Ask your health care provider what types of exercise are appropriate for you.  If you need a cane or walker, use it as recommended by your health care provider.  Wear supportive shoes that have nonskid soles. Lifestyle  Do not drink alcohol if your health care provider tells you not to drink.  If you drink alcohol, limit how much you have: ? 0-1 drink a day for women. ? 0-2 drinks a day for men.  Be aware of how much alcohol is in your drink. In the [place] one drink equals one typical bottle of beer (12 oz), one-half glass of wine (5 oz), or one shot of hard liquor (1 oz).  Do not use any products that contain nicotine or tobacco, such as cigarettes and e-cigarettes. If you need help quitting, ask your health care provider. Summary  Having a healthy lifestyle and getting preventive care can help to protect your health and wellness after age 53.  Screening and testing are the best way to find a health problem early and help you avoid having a fall. Early diagnosis and treatment give you the best chance for managing medical conditions that are more common for people who are older than age 63.  Falls are a major cause of broken bones and head injuries in people who are older than age 90. Take precautions to prevent a fall at home.  Work with your health care provider to learn what changes you can make to improve your health and wellness and to prevent falls. This information is not intended to replace advice given to you by your health care provider. Make sure you discuss any questions you have with your health care provider. Document Revised: 12/03/2018 Document Reviewed: 06/25/2017 Elsevier Patient Education  2020 [employer].

## 2020-01-03 NOTE — Progress Notes (Signed)
BP (!) 143/78 (BP Location: Left Arm, Patient Position: Sitting, Cuff Size: Normal)   Pulse 82   Temp 98.8 F (37.1 C) (Oral)   Ht 5\' 11"  (1.803 m)   Wt 190 lb 12.8 oz (86.5 kg)   SpO2 97%   BMI 26.61 kg/m    Subjective:    Patient ID: Richard Wolfe, male    DOB: 10-27-1950, 69 y.o.   MRN: [id number]  HPI: Richard Wolfe is a 69 y.o. male presenting on 01/03/2020 for comprehensive medical examination. Current medical complaints include:none  He currently lives with: wife Interim Problems from his last visit: no  Functional Status Survey:    FALL RISK: Fall Risk  10/13/2019 09/29/2019 08/09/2019  Falls in the past year? 0 0 0  Number falls in past yr: - - 0  Injury with Fall? - - 0  Follow up - - Falls evaluation completed    Depression Screen Depression screen [facility] 2/9 08/09/2019  Decreased Interest 0  Down, Depressed, Hopeless 0  PHQ - 2 Score 0    Advanced Directives Has one- we don't have it  Past Medical History:  Past Medical History:  Diagnosis Date  . Aortic regurgitation   . Arthritis   . Asthma   . Hernia, inguinal, recurrent   . Nasal polyps   . Pneumonia   . PONV (postoperative nausea and vomiting)     Surgical History:  Past Surgical History:  Procedure Laterality Date  . CATARACT EXTRACTION W/ INTRAOCULAR LENS IMPLANT Left 10/19/2008  . CATARACT EXTRACTION W/ INTRAOCULAR LENS IMPLANT Right 11/09/2008  . ENDOVASCULAR REPAIR/STENT GRAFT N/A 12/08/2019   Procedure: ENDOVASCULAR REPAIR/STENT GRAFT;  Surgeon: 12/10/2019, MD;  Location: [facility] INVASIVE CV LAB;  Service: Cardiovascular;  Laterality: N/A;  . EYE SURGERY    . INGUINAL HERNIA REPAIR Left    1999  . INGUINAL HERNIA REPAIR Right    2019  . NASAL POLYP EXCISION     1981, 1986, 1993, 1994, 1995, 1996, 1998, 1999, 2001  . NASAL SEPTUM SURGERY     1981  . REFRACTIVE SURGERY  2008  . trigeminal nerve block  2003  . TRIGEMINAL NERVE DECOMPRESSION  2002, 2003  . WISDOM  TOOTH EXTRACTION  1967  . XI ROBOTIC ASSISTED INGUINAL HERNIA REPAIR WITH MESH Left 2019    Medications:  Current Outpatient Medications on File Prior to Visit  Medication Sig  . Ascorbic Acid (VITAMIN C) 1000 MG tablet Take 1,000 mg by mouth daily.  2020 aspirin EC 81 MG EC tablet Take 1 tablet (81 mg total) by mouth daily at 6 (six) AM.  . budesonide (PULMICORT) 0.5 MG/2ML nebulizer solution Take 0.5 mg by nebulization 2 (two) times daily.   . Cholecalciferol (VITAMIN D3) 125 MCG (5000 UT) CAPS Take 5,000 Units by mouth daily.   . cyanocobalamin 1000 MCG tablet Take 1,000 mcg by mouth daily.   . DHA-EPA-Vitamin E (OMEGA-3 COMPLEX PO) Take 1,280 mg by mouth daily.  Marland Kitchen doxycycline (PERIOSTAT) 20 MG tablet Take 20 mg by mouth 2 (two) times daily.  . Folic Acid (FOLATE PO) Take 400 mcg by mouth daily.  . Magnesium 400 MG CAPS Take 800 mg by mouth daily.   . milk thistle 175 MG tablet Take 350 mg by mouth daily.  . Misc Natural Products (JOINT HEALTH PO) Take 250 mg by mouth daily.  . SYMBICORT 80-4.5 MCG/ACT inhaler Inhale 1 puff into the lungs 2 (two) times daily.  . TURMERIC PO Take 2,000  mg by mouth daily.    No current facility-administered medications on file prior to visit.    Allergies:  Allergies  Allergen Reactions  . Sulfur Anaphylaxis  . Penicillins     Convulsions Did it involve swelling of the face/tongue/throat, SOB, or low BP? No Did it involve sudden or severe rash/hives, skin peeling, or any reaction on the inside of your mouth or nose? No Did you need to seek medical attention at a hospital or doctor's office? Yes When did it last happen? If all above answers are "NO", may proceed with cephalosporin use.     Social History:  Social History   Socioeconomic History  . Marital status: Married    Spouse name: Not on file  . Number of children: Not on file  . Years of education: Not on file  . Highest education level: Not on file  Occupational History  .  Not on file  Tobacco Use  . Smoking status: Never Smoker  . Smokeless tobacco: Never Used  Substance and Sexual Activity  . Alcohol use: Yes    Alcohol/week: 2.0 standard drinks    Types: 1 Glasses of wine, 1 Cans of beer per week  . Drug use: Never  . Sexual activity: Yes  Other Topics Concern  . Not on file  Social History Narrative  . Not on file   Social Determinants of Health   Financial Resource Strain: Low Risk   . Difficulty of Paying Living Expenses: Not hard at all  Food Insecurity: No Food Insecurity  . Worried About Programme researcher, broadcasting/film/video in the Last Year: Never true  . Ran Out of Food in the Last Year: Never true  Transportation Needs: No Transportation Needs  . Lack of Transportation (Medical): No  . Lack of Transportation (Non-Medical): No  Physical Activity: Sufficiently Active  . Days of Exercise per Week: 5 days  . Minutes of Exercise per Session: 30 min  Stress: No Stress Concern Present  . Feeling of Stress : Not at all  Social Connections: Somewhat Isolated  . Frequency of Communication with Friends and Family: More than three times a week  . Frequency of Social Gatherings with Friends and Family: More than three times a week  . Attends Religious Services: Never  . Active Member of Clubs or Organizations: No  . Attends Banker Meetings: Never  . Marital Status: Married  Catering manager Violence: Not At Risk  . Fear of Current or Ex-Partner: No  . Emotionally Abused: No  . Physically Abused: No  . Sexually Abused: No   Social History   Tobacco Use  Smoking Status Never Smoker  Smokeless Tobacco Never Used   Social History   Substance and Sexual Activity  Alcohol Use Yes  . Alcohol/week: 2.0 standard drinks  . Types: 1 Glasses of wine, 1 Cans of beer per week    Family History:  Family History  Problem Relation Age of Onset  . Rheum arthritis Mother   . Cancer Father        liver  . Arthritis Brother   . Cancer Paternal  Grandfather        throat  . Cancer Brother        liver  . Heart disease Half-Brother        Pacemaker/ICD  . Heart failure Half-Brother   . Colon cancer Paternal Aunt   . Colon cancer Paternal Uncle     Past medical history, surgical history, medications, allergies, family  history and social history reviewed with patient today and changes made to appropriate areas of the chart.   Review of Systems  Constitutional: Negative.   HENT: Negative.   Eyes: Negative.   Respiratory: Negative.   Cardiovascular: Positive for leg swelling. Negative for chest pain, palpitations, orthopnea, claudication and PND.  Gastrointestinal: Negative.   Genitourinary: Negative.        + nocturia   Musculoskeletal: Negative.   Skin: Negative.   Neurological: Negative.   Endo/Heme/Allergies: Positive for polydipsia. Negative for environmental allergies. Does not bruise/bleed easily.  Psychiatric/Behavioral: Negative.     All other ROS negative except what is listed above and in the HPI.      Objective:    BP (!) 143/78 (BP Location: Left Arm, Patient Position: Sitting, Cuff Size: Normal)   Pulse 82   Temp 98.8 F (37.1 C) (Oral)   Ht 5\' 11"  (1.803 m)   Wt 190 lb 12.8 oz (86.5 kg)   SpO2 97%   BMI 26.61 kg/m   Wt Readings from Last 3 Encounters:  01/03/20 190 lb 12.8 oz (86.5 kg)  12/16/19 191 lb (86.6 kg)  12/08/19 186 lb (84.4 kg)    Physical Exam Vitals and nursing note reviewed.  Constitutional:      General: He is not in acute distress.    Appearance: Normal appearance. He is not ill-appearing, toxic-appearing or diaphoretic.  HENT:     Head: Normocephalic and atraumatic.     Right Ear: Tympanic membrane, ear canal and external ear normal. There is no impacted cerumen.     Left Ear: Tympanic membrane, ear canal and external ear normal. There is no impacted cerumen.     Nose: Nose normal. No congestion or rhinorrhea.     Mouth/Throat:     Mouth: Mucous membranes are moist.      Pharynx: Oropharynx is clear. No oropharyngeal exudate or posterior oropharyngeal erythema.  Eyes:     General: No scleral icterus.       Right eye: No discharge.        Left eye: No discharge.     Extraocular Movements: Extraocular movements intact.     Conjunctiva/sclera: Conjunctivae normal.     Pupils: Pupils are equal, round, and reactive to light.  Neck:     Vascular: No carotid bruit.  Cardiovascular:     Rate and Rhythm: Normal rate and regular rhythm.     Pulses: Normal pulses.     Heart sounds: No murmur. No friction rub. No gallop.   Pulmonary:     Effort: Pulmonary effort is normal. No respiratory distress.     Breath sounds: Normal breath sounds. No stridor. No wheezing, rhonchi or rales.  Chest:     Chest wall: No tenderness.  Abdominal:     General: Abdomen is flat. Bowel sounds are normal. There is no distension.     Palpations: Abdomen is soft. There is no mass.     Tenderness: There is no abdominal tenderness. There is no right CVA tenderness, left CVA tenderness, guarding or rebound.     Hernia: No hernia is present.  Genitourinary:    Comments: Genital exam deferred with shared decision making Musculoskeletal:        General: No swelling, tenderness, deformity or signs of injury.     Cervical back: Normal range of motion and neck supple. No rigidity. No muscular tenderness.     Right lower leg: No edema.     Left lower leg: No edema.  Lymphadenopathy:     Cervical: No cervical adenopathy.  Skin:    General: Skin is warm and dry.     Capillary Refill: Capillary refill takes less than 2 seconds.     Coloration: Skin is not jaundiced or pale.     Findings: No bruising, erythema, lesion or rash.  Neurological:     General: No focal deficit present.     Mental Status: He is alert and oriented to person, place, and time.     Cranial Nerves: No cranial nerve deficit.     Sensory: No sensory deficit.     Motor: No weakness.     Coordination: Coordination  normal.     Gait: Gait normal.     Deep Tendon Reflexes: Reflexes normal.  Psychiatric:        Mood and Affect: Mood normal.        Behavior: Behavior normal.        Thought Content: Thought content normal.        Judgment: Judgment normal.     6CIT Screen 01/03/2020  What Year? 0 points  What month? 0 points  What time? 0 points  Count back from 20 0 points  Months in reverse 0 points  Repeat phrase 8 points  Total Score 8     Results for orders placed or performed in visit on 01/03/20  Bayer DCA Hb A1c Waived  Result Value Ref Range   HB A1C (BAYER DCA - WAIVED) 5.2 <7.0 %  Lipid Panel w/o Chol/HDL Ratio  Result Value Ref Range   Cholesterol, Total 152 100 - 199 mg/dL   Triglycerides 69 0 - 149 mg/dL   HDL 49 >81 mg/dL   VLDL Cholesterol Cal 14 5 - 40 mg/dL   LDL Chol Calc (NIH) 89 0 - 99 mg/dL  Microalbumin, Urine Waived  Result Value Ref Range   Microalb, Ur Waived 30 (H) 0 - 19 mg/L   Creatinine, Urine Waived 200 10 - 300 mg/dL   Microalb/Creat Ratio <30 <30 mg/g  PSA  Result Value Ref Range   Prostate Specific Ag, Serum 0.2 0.0 - 4.0 ng/mL  TSH  Result Value Ref Range   TSH 3.250 0.450 - 4.500 uIU/mL  Urinalysis, Routine w reflex microscopic  Result Value Ref Range   Specific Gravity, UA >1.030 (H) 1.005 - 1.030   pH, UA 5.0 5.0 - 7.5   Color, UA Yellow Yellow   Appearance Ur Clear Clear   Leukocytes,UA Negative Negative   Protein,UA Negative Negative/Trace   Glucose, UA Negative Negative   Ketones, UA Trace (A) Negative   RBC, UA Negative Negative   Bilirubin, UA Negative Negative   Urobilinogen, Ur 0.2 0.2 - 1.0 mg/dL   Nitrite, UA Negative Negative  VITAMIN D 25 Hydroxy (Vit-D Deficiency, Fractures)  Result Value Ref Range   Vit D, 25-Hydroxy 47.1 30.0 - 100.0 ng/mL  B12  Result Value Ref Range   Vitamin B-12 >2000 (H) 232 - 1245 pg/mL  Hepatitis C Antibody  Result Value Ref Range   Hep C Virus Ab <0.1 0.0 - 0.9 s/co ratio      Assessment &  Plan:   Problem List Items Addressed This Visit      Cardiovascular and Mediastinum   White coat syndrome without diagnosis of hypertension    Continue to monitor at home. Will check microalbumin. Await results. Call with any concerns.       Relevant Orders   Microalbumin, Urine Waived (Completed)  Other   B12 deficiency    Labs drawn today. Await results.       Relevant Orders   B12 (Completed)   Vitamin D deficiency    Labs drawn today. Await results.       Relevant Orders   VITAMIN D 25 Hydroxy (Vit-D Deficiency, Fractures) (Completed)   Advance directive discussed with patient    A voluntary discussion about advance care planning including the explanation and discussion of advance directives was extensively discussed  with the patient for 3 minutes with patient present.  Explanation about the health care proxy and Living will was reviewed and packet with forms with explanation of how to fill them out was given.  During this discussion, the patient was able to identify a health care proxy as his wife and has filled out the paperwork required.  Patient was offered a separate [place] visit for further assistance with forms.  He will bring in a copy as able.         Other Visit Diagnoses    Medicare annual wellness visit, initial    -  Primary   Preventative care discussed today as below.    Screening for cardiovascular condition       Labs drawn today. Await results.    Relevant Orders   Lipid Panel w/o Chol/HDL Ratio (Completed)   Screening for diabetes mellitus       Labs drawn today. Await results.    Relevant Orders   Bayer DCA Hb A1c Waived (Completed)   Urinalysis, Routine w reflex microscopic (Completed)   Need for hepatitis C screening test       Labs drawn today. Await results.    Relevant Orders   Hepatitis C Antibody (Completed)   Hesitancy       Labs drawn today. Await results.    Relevant Orders   PSA (Completed)   Urinalysis, Routine  w reflex microscopic (Completed)   Fatigue, unspecified type       Labs drawn today. Await results.    Relevant Orders   TSH (Completed)       Preventative Services:  Health Risk Assessment and Personalized Prevention Plan: Done today Bone Mass Measurements: N/A CVD Screening: Done today Colon Cancer Screening: Declined Depression Screening: Done today Diabetes Screening: Done today Glaucoma Screening: See your eye doctor Hepatitis B vaccine: N/A Hepatitis C screening: Done today HIV Screening: N/A Flu Vaccine: Get in the fall Lung cancer Screening: N/A Obesity Screening: Done today Pneumonia Vaccines (2): Up to date STI Screening: N/A PSA screening: Done today  Discussed aspirin prophylaxis for myocardial infarction prevention and decision was made to continue ASA  LABORATORY TESTING:  Health maintenance labs ordered today as discussed above.   The natural history of prostate cancer and ongoing controversy regarding screening and potential treatment outcomes of prostate cancer has been discussed with the patient. The meaning of a false positive PSA and a false negative PSA has been discussed. He indicates understanding of the limitations of this screening test and wishes to proceed with screening PSA testing.  - Tdap: Tetanus vaccination status reviewed: Refused. - Influenza: Postponed to flu season - Pneumovax: Up to date - Prevnar: Up to date  SCREENING: - Colonoscopy: Has had issues with prep for the last 3 times. Declines now.   Discussed with patient purpose of the colonoscopy is to detect colon cancer at curable precancerous or early stages   - AAA Screening:Not applicable  -Hearing Test: Ordered today   Follow  up plan: NEXT PREVENTATIVE PHYSICAL DUE IN 1 YEAR. Return in about 3 months (around 04/04/2020), or with PCP.

## 2020-01-03 NOTE — Progress Notes (Deleted)
BP (!) 143/78 (BP Location: Left Arm, Patient Position: Sitting, Cuff Size: Normal)   Pulse 82   Temp 98.8 F (37.1 C) (Oral)   Ht 5\' 11"  (1.803 m)   Wt 190 lb 12.8 oz (86.5 kg)   SpO2 97%   BMI 26.61 kg/m    Subjective:    Patient ID: Richard Wolfe, male    DOB: 01/02/1951, 69 y.o.   MRN: [id number]  HPI: Richard Wolfe is a 69 y.o. male presenting on 01/03/2020 for comprehensive medical examination. Current medical complaints include:none  He currently lives with: wife Interim Problems from his last visit: no  Depression Screen done today and results listed below:  Depression screen PHQ 2/9 08/09/2019  Decreased Interest 0  Down, Depressed, Hopeless 0  PHQ - 2 Score 0    Past Medical History:  Past Medical History:  Diagnosis Date  . Aortic regurgitation   . Arthritis   . Asthma   . Hernia, inguinal, recurrent   . Nasal polyps   . Pneumonia   . PONV (postoperative nausea and vomiting)     Surgical History:  Past Surgical History:  Procedure Laterality Date  . CATARACT EXTRACTION W/ INTRAOCULAR LENS IMPLANT Left 10/19/2008  . CATARACT EXTRACTION W/ INTRAOCULAR LENS IMPLANT Right 11/09/2008  . ENDOVASCULAR REPAIR/STENT GRAFT N/A 12/08/2019   Procedure: ENDOVASCULAR REPAIR/STENT GRAFT;  Surgeon: 12/10/2019, MD;  Location: [facility];  Service: Cardiovascular;  Laterality: N/A;  . EYE SURGERY    . INGUINAL HERNIA REPAIR Left    1999  . INGUINAL HERNIA REPAIR Right    2019  . NASAL POLYP EXCISION     1981, 1986, 1993, 1994, 1995, 1996, 1998, 1999, 2001  . NASAL SEPTUM SURGERY     1981  . REFRACTIVE SURGERY  2008  . trigeminal nerve block  2003  . TRIGEMINAL NERVE DECOMPRESSION  2002, 2003  . WISDOM TOOTH EXTRACTION  1967  . XI ROBOTIC ASSISTED INGUINAL HERNIA REPAIR WITH MESH Left 2019    Medications:  Current Outpatient Medications on File Prior to Visit  Medication Sig  . Ascorbic Acid (VITAMIN C) 1000 MG tablet Take 1,000 mg  by mouth daily.  2020 aspirin EC 81 MG EC tablet Take 1 tablet (81 mg total) by mouth daily at 6 (six) AM.  . budesonide (PULMICORT) 0.5 MG/2ML nebulizer solution Take 0.5 mg by nebulization 2 (two) times daily.   . Cholecalciferol (VITAMIN D3) 125 MCG (5000 UT) CAPS Take 5,000 Units by mouth daily.   . cyanocobalamin 1000 MCG tablet Take 1,000 mcg by mouth daily.   . DHA-EPA-Vitamin E (OMEGA-3 COMPLEX PO) Take 1,280 mg by mouth daily.  Marland Kitchen doxycycline (PERIOSTAT) 20 MG tablet Take 20 mg by mouth 2 (two) times daily.  . Folic Acid (FOLATE PO) Take 400 mcg by mouth daily.  . Magnesium 400 MG CAPS Take 800 mg by mouth daily.   . milk thistle 175 MG tablet Take 350 mg by mouth daily.  . Misc Natural Products (JOINT HEALTH PO) Take 250 mg by mouth daily.  . SYMBICORT 80-4.5 MCG/ACT inhaler Inhale 1 puff into the lungs 2 (two) times daily.  . TURMERIC PO Take 2,000 mg by mouth daily.    No current facility-administered medications on file prior to visit.    Allergies:  Allergies  Allergen Reactions  . Sulfur Anaphylaxis  . Penicillins     Convulsions Did it involve swelling of the face/tongue/throat, SOB, or low BP? No Did it involve  sudden or severe rash/hives, skin peeling, or any reaction on the inside of your mouth or nose? No Did you need to seek medical attention at a hospital or doctor's office? Yes When did it last happen? If all above answers are "NO", may proceed with cephalosporin use.     Social History:  Social History   Socioeconomic History  . Marital status: Married    Spouse name: Not on file  . Number of children: Not on file  . Years of education: Not on file  . Highest education level: Not on file  Occupational History  . Not on file  Tobacco Use  . Smoking status: Never Smoker  . Smokeless tobacco: Never Used  Substance and Sexual Activity  . Alcohol use: Yes    Alcohol/week: 2.0 standard drinks    Types: 1 Glasses of wine, 1 Cans of beer per week  .  Drug use: Never  . Sexual activity: Yes  Other Topics Concern  . Not on file  Social History Narrative  . Not on file   Social Determinants of Health   Financial Resource Strain: Low Risk   . Difficulty of Paying Living Expenses: Not hard at all  Food Insecurity: No Food Insecurity  . Worried About Programme researcher, broadcasting/film/video in the Last Year: Never true  . Ran Out of Food in the Last Year: Never true  Transportation Needs: No Transportation Needs  . Lack of Transportation (Medical): No  . Lack of Transportation (Non-Medical): No  Physical Activity: Sufficiently Active  . Days of Exercise per Week: 5 days  . Minutes of Exercise per Session: 30 min  Stress: No Stress Concern Present  . Feeling of Stress : Not at all  Social Connections: Somewhat Isolated  . Frequency of Communication with Friends and Family: More than three times a week  . Frequency of Social Gatherings with Friends and Family: More than three times a week  . Attends Religious Services: Never  . Active Member of Clubs or Organizations: No  . Attends Banker Meetings: Never  . Marital Status: Married  Catering manager Violence: Not At Risk  . Fear of Current or Ex-Partner: No  . Emotionally Abused: No  . Physically Abused: No  . Sexually Abused: No   Social History   Tobacco Use  Smoking Status Never Smoker  Smokeless Tobacco Never Used   Social History   Substance and Sexual Activity  Alcohol Use Yes  . Alcohol/week: 2.0 standard drinks  . Types: 1 Glasses of wine, 1 Cans of beer per week    Family History:  Family History  Problem Relation Age of Onset  . Rheum arthritis Mother   . Cancer Father        liver  . Arthritis Brother   . Cancer Paternal Grandfather        throat  . Cancer Brother        liver  . Heart disease Half-Brother        Pacemaker/ICD  . Heart failure Half-Brother   . Colon cancer Paternal Aunt   . Colon cancer Paternal Uncle     Past medical history,  surgical history, medications, allergies, family history and social history reviewed with patient today and changes made to appropriate areas of the chart.   ROS  All other ROS negative except what is listed above and in the HPI.      Objective:    BP (!) 143/78 (BP Location: Left Arm, Patient Position:  Sitting, Cuff Size: Normal)   Pulse 82   Temp 98.8 F (37.1 C) (Oral)   Ht 5\' 11"  (1.803 m)   Wt 190 lb 12.8 oz (86.5 kg)   SpO2 97%   BMI 26.61 kg/m   Wt Readings from Last 3 Encounters:  01/03/20 190 lb 12.8 oz (86.5 kg)  12/16/19 191 lb (86.6 kg)  12/08/19 186 lb (84.4 kg)     Hearing Screening   125Hz  250Hz  500Hz  1000Hz  2000Hz  3000Hz  4000Hz  6000Hz  8000Hz   Right ear:           Left ear:             Visual Acuity Screening   Right eye Left eye Both eyes  Without correction:     With correction: 20/25 20/30 20/25     Physical Exam  No flowsheet data found.   Results for orders placed or performed during the hospital encounter of 12/08/19  Glucose, capillary  Result Value Ref Range   Glucose-Capillary 112 (H) 70 - 99 mg/dL  CBC  Result Value Ref Range   WBC 10.2 4.0 - 10.5 K/uL   RBC 4.12 (L) 4.22 - 5.81 MIL/uL   Hemoglobin 13.3 13.0 - 17.0 g/dL   HCT (L) - %   MCV 92.5 80.0 - 100.0 fL   MCH 32.3 26.0 - 34.0 pg   MCHC 34.9 30.0 - 36.0 g/dL   RDW  - %   Platelets 162 150 - 400 K/uL   nRBC 0.0 0.0 - 0.2 %  Basic metabolic panel  Result Value Ref Range   Sodium 138 135 - 145 mmol/L   Potassium 4.0 3.5 - 5.1 mmol/L   Chloride 105 98 - 111 mmol/L   CO2 25 22 - 32 mmol/L   Glucose, Bld 117 (H) 70 - 99 mg/dL   BUN 13 8 - 23 mg/dL   Creatinine, Ser 0.61 - 1.24 mg/dL   Calcium 8.7 (L) 8.9 - 10.3 mg/dL   GFR calc non Af Amer >60 >60 mL/min   GFR calc Af Amer >60 >60 mL/min   Anion gap 8 5 - 15  Magnesium  Result Value Ref Range   Magnesium 2.1 1.7 - 2.4 mg/dL  ABO/Rh  Result Value Ref Range   ABO/RH(D)      A POS  Performed at [facility], [address], [place], 12/10/19 56.2       Assessment & Plan:   Problem List Items Addressed This Visit      Cardiovascular and Mediastinum   White coat syndrome without diagnosis of hypertension   Relevant Orders   CBC with Differential/Platelet   Comprehensive metabolic panel   Microalbumin, Urine Waived     Other   B12 deficiency   Relevant Orders   CBC with Differential/Platelet   B12   Vitamin D deficiency   Relevant Orders   VITAMIN D 25 Hydroxy (Vit-D Deficiency, Fractures)    Other Visit Diagnoses    Medicare annual wellness visit, initial    -  Primary   Screening for cardiovascular condition       Relevant Orders   Lipid Panel w/o Chol/HDL Ratio   Screening for diabetes mellitus       Relevant Orders   Bayer DCA Hb A1c Waived   Urinalysis, Routine w reflex microscopic   Need for hepatitis C screening test       Relevant Orders   Hepatitis C Antibody   Hesitancy  Relevant Orders   PSA   Urinalysis, Routine w reflex microscopic   Fatigue, unspecified type       Relevant Orders   TSH       Discussed aspirin prophylaxis for myocardial infarction prevention and decision was made to continue ASA  LABORATORY TESTING:  Health maintenance labs ordered today as discussed above.   The natural history of prostate cancer and ongoing controversy regarding screening and potential treatment outcomes of prostate cancer has been discussed with the patient. The meaning of a false positive PSA and a false negative PSA has been discussed. He indicates understanding of the limitations of this screening test and wishes to proceed with screening PSA testing.   IMMUNIZATIONS:   - Tdap: Tetanus vaccination status reviewed: Refused. - Influenza: Postponed to flu season - Pneumovax: Up to date - Prevnar: Up to date  SCREENING: - Colonoscopy: Has had issues with prep for the last 3 times. Declines now.   Discussed with  patient purpose of the colonoscopy is to detect colon cancer at curable precancerous or early stages   - AAA Screening: Not applicable  -Hearing Test: Ordered today    Follow up plan: NEXT PREVENTATIVE PHYSICAL DUE IN 1 YEAR. No follow-ups on file.

## 2020-01-04 ENCOUNTER — Telehealth: Payer: Self-pay | Admitting: Nurse Practitioner

## 2020-01-04 LAB — PSA: Prostate Specific Ag, Serum: 0.2 ng/mL (ref 0.0–4.0)

## 2020-01-04 LAB — LIPID PANEL W/O CHOL/HDL RATIO
Cholesterol, Total: 152 mg/dL (ref 100–199)
HDL: 49 mg/dL (ref >39)
LDL Chol Calc (NIH): 89 mg/dL (ref 0–99)
Triglycerides: 69 mg/dL (ref 0–149)
VLDL Cholesterol Cal: 14 mg/dL (ref 5–40)

## 2020-01-04 LAB — VITAMIN D 25 HYDROXY (VIT D DEFICIENCY, FRACTURES): Vit D, 25-Hydroxy: 47.1 ng/mL (ref 30.0–100.0)

## 2020-01-04 LAB — HEPATITIS C ANTIBODY: Hep C Virus Ab: <0.1 s/co ratio (ref 0.0–0.9)

## 2020-01-04 LAB — VITAMIN B12: Vitamin B-12: >2000 pg/mL — ABNORMAL HIGH (ref 232–1245)

## 2020-01-04 LAB — TSH: TSH: 3.25 u[IU]/mL (ref 0.450–4.500)

## 2020-01-04 NOTE — Assessment & Plan Note (Signed)
A voluntary discussion about advance care planning including the explanation and discussion of advance directives was extensively discussed  with the patient for 3 minutes with patient present.  Explanation about the health care proxy and Living will was reviewed and packet with forms with explanation of how to fill them out was given.  During this discussion, the patient was able to identify a health care proxy as his wife and has filled out the paperwork required.  Patient was offered a separate Advance Care Planning visit for further assistance with forms.  He will bring in a copy as able.

## 2020-01-04 NOTE — Telephone Encounter (Signed)
Had visit with Dr. Laural Benes yesterday, these will be released once they have been reviewed by provider.

## 2020-01-04 NOTE — Assessment & Plan Note (Signed)
Continue to monitor at home. Will check microalbumin. Await results. Call with any concerns.

## 2020-01-04 NOTE — Telephone Encounter (Signed)
Copied from CRM [id number]. Topic: Quick Communication - See Telephone Encounter >> Jan 04, 2020  1:25 PM Angela Nevin wrote: Patient requesting lab results from 5/10 released onto his mychart.

## 2020-01-04 NOTE — Telephone Encounter (Signed)
He doesn't have mychart.

## 2020-01-04 NOTE — Assessment & Plan Note (Signed)
Labs drawn today. Await results.  

## 2020-01-04 NOTE — Telephone Encounter (Signed)
Mychart code resent, labs mailed.

## 2020-01-20 ENCOUNTER — Other Ambulatory Visit (INDEPENDENT_AMBULATORY_CARE_PROVIDER_SITE_OTHER): Payer: Self-pay | Admitting: Nurse Practitioner

## 2020-01-20 ENCOUNTER — Other Ambulatory Visit: Payer: Self-pay

## 2020-01-20 ENCOUNTER — Ambulatory Visit (INDEPENDENT_AMBULATORY_CARE_PROVIDER_SITE_OTHER): Payer: Medicare Other | Admitting: Nurse Practitioner

## 2020-01-20 ENCOUNTER — Encounter (INDEPENDENT_AMBULATORY_CARE_PROVIDER_SITE_OTHER): Payer: Self-pay | Admitting: Nurse Practitioner

## 2020-01-20 ENCOUNTER — Ambulatory Visit (INDEPENDENT_AMBULATORY_CARE_PROVIDER_SITE_OTHER): Payer: Medicare Other

## 2020-01-20 VITALS — BP 145/75 | HR 68 | Resp 16 | Ht 71.0 in | Wt 193.0 lb

## 2020-01-20 DIAGNOSIS — I723 Aneurysm of iliac artery: Secondary | ICD-10-CM

## 2020-01-20 DIAGNOSIS — M170 Bilateral primary osteoarthritis of knee: Secondary | ICD-10-CM

## 2020-01-25 ENCOUNTER — Encounter (INDEPENDENT_AMBULATORY_CARE_PROVIDER_SITE_OTHER): Payer: Self-pay | Admitting: Nurse Practitioner

## 2020-01-25 NOTE — Progress Notes (Signed)
Subjective:    Patient ID: Richard Wolfe, male    DOB: 10-10-1950, 69 y.o.   MRN: [id number] Chief Complaint  Patient presents with  . Follow-up    ultrasound    The patient returns to the office today for evaluation of endovascular aortic repair that was done on 12/08/2019 related to iliac artery aneurysms.  The patient's biggest complication post procedure was bruising.  That is largely resolved today.  The patient's puncture sites are mostly healed at this time.  The patient denies any issues following his procedure.  He denies any claudication or rest pain like symptoms.  He denies any signs of peripheral embolization.  Today noninvasive studies show a left common iliac artery of 1.3 cm with the right of 1.2 cm down from 3 cm and 2 cm respectively.  The patient has no evidence of an old abdominal aortic aneurysm sac seen.  The endovascular aneurysm repair has no evidence of endoleak today.   Review of Systems  Hematological: Bruises/bleeds easily.       Objective:   Physical Exam Vitals reviewed.  HENT:     Head: Normocephalic.  Cardiovascular:     Rate and Rhythm: Normal rate and regular rhythm.     Pulses: Normal pulses.     Heart sounds: Normal heart sounds.  Pulmonary:     Effort: Pulmonary effort is normal.  Neurological:     Mental Status: He is alert and oriented to person, place, and time.  Psychiatric:        Mood and Affect: Mood normal.        Behavior: Behavior normal.        Thought Content: Thought content normal.        Judgment: Judgment normal.     BP (!) 145/75 (BP Location: Right Arm)   Pulse 68   Resp 16   Ht 5\' 11"  (1.803 m)   Wt 193 lb (87.5 kg)   BMI 26.92 kg/m   Past Medical History:  Diagnosis Date  . Aortic regurgitation   . Arthritis   . Asthma   . Hernia, inguinal, recurrent   . Nasal polyps   . Pneumonia   . PONV (postoperative nausea and vomiting)     Social History   Socioeconomic History  . Marital status: Married     Spouse name: Not on file  . Number of children: Not on file  . Years of education: Not on file  . Highest education level: Not on file  Occupational History  . Not on file  Tobacco Use  . Smoking status: Never Smoker  . Smokeless tobacco: Never Used  Substance and Sexual Activity  . Alcohol use: Yes    Alcohol/week: 2.0 standard drinks    Types: 1 Glasses of wine, 1 Cans of beer per week  . Drug use: Never  . Sexual activity: Yes  Other Topics Concern  . Not on file  Social History Narrative  . Not on file   Social Determinants of Health   Financial Resource Strain: Low Risk   . Difficulty of Paying Living Expenses: Not hard at all  Food Insecurity: No Food Insecurity  . Worried About in the Last Year: Never true  . Ran Out of Food in the Last Year: Never true  Transportation Needs: No Transportation Needs  . Lack of Transportation (Medical): No  . Lack of Transportation (Non-Medical): No  Physical Activity: Sufficiently Active  . Days of Exercise per Week:  5 days  . Minutes of Exercise per Session: 30 min  Stress: No Stress Concern Present  . Feeling of Stress : Not at all  Social Connections: Somewhat Isolated  . Frequency of Communication with Friends and Family: More than three times a week  . Frequency of Social Gatherings with Friends and Family: More than three times a week  . Attends Religious Services: Never  . Active Member of Clubs or Organizations: No  . Attends Archivist Meetings: Never  . Marital Status: Married  Human resources officer Violence: Not At Risk  . Fear of Current or Ex-Partner: No  . Emotionally Abused: No  . Physically Abused: No  . Sexually Abused: No    Past Surgical History:  Procedure Laterality Date  . CATARACT EXTRACTION W/ INTRAOCULAR LENS IMPLANT Left 10/19/2008  . CATARACT EXTRACTION W/ INTRAOCULAR LENS IMPLANT Right 11/09/2008  . ENDOVASCULAR REPAIR/STENT GRAFT N/A 12/08/2019   Procedure:  ENDOVASCULAR REPAIR/STENT GRAFT;  Surgeon: Katha Cabal, MD;  Location: [facility];  Service: Cardiovascular;  Laterality: N/A;  . EYE SURGERY    . INGUINAL HERNIA REPAIR Left    1999  . INGUINAL HERNIA REPAIR Right    2019  . [place], [place], [place], 1998, 1999, 2001  . NASAL SEPTUM SURGERY     1981  . REFRACTIVE SURGERY  2008  . trigeminal nerve block  2003  . TRIGEMINAL NERVE DECOMPRESSION  2002, 2003  . [place]  . XI ROBOTIC ASSISTED INGUINAL HERNIA REPAIR WITH MESH Left 2019    Family History  Problem Relation Age of Onset  . Rheum arthritis Mother   . Cancer Father        liver  . Arthritis Brother   . Cancer Paternal Grandfather        throat  . Cancer Brother        liver  . Heart disease Half-Brother        Pacemaker/ICD  . Heart failure Half-Brother   . Colon cancer Paternal Aunt   . Colon cancer Paternal Uncle     Allergies  Allergen Reactions  . Sulfur Anaphylaxis  . Penicillins     Convulsions Did it involve swelling of the face/tongue/throat, SOB, or low BP? No Did it involve sudden or severe rash/hives, skin peeling, or any reaction on the inside of your mouth or nose? No Did you need to seek medical attention at a hospital or doctor's office? Yes When did it last happen? If all above answers are "NO", may proceed with cephalosporin use.        Assessment & Plan:   1. Iliac artery aneurysm ([place]) Overall patient doing well post procedure.  While the primary reason for endovascular aortic repair was the iliac artery aneurysms, there is no evidence of endoleak today.  There is also no old abdominal aortic sac seen.  Iliac aneurysms have also appear to shrink in size.  Patient will continue with Plavix and aspirin at this time.  Patient return in 6 months with noninvasive studies.  2. Primary osteoarthritis of both knees Patient working on upcoming knee replacement surgery.   Patient should have adequate perfusion for wound healing.  Otherwise patient will continue with management as directed by his primary care.   Current Outpatient Medications on File Prior to Visit  Medication Sig Dispense Refill  . Ascorbic Acid (VITAMIN C) 1000 MG tablet Take 1,000 mg by mouth  daily.    . aspirin EC 81 MG EC tablet Take 1 tablet (81 mg total) by mouth daily at 6 (six) AM. 90 tablet 3  . budesonide (PULMICORT) 0.5 MG/2ML nebulizer solution Take 0.5 mg by nebulization 2 (two) times daily.     . Cholecalciferol (VITAMIN D3) 125 MCG (5000 UT) CAPS Take 5,000 Units by mouth daily.     . clopidogrel (PLAVIX) 75 MG tablet Take 75 mg by mouth daily.    . cyanocobalamin 1000 MCG tablet Take 1,000 mcg by mouth daily.     . DHA-EPA-Vitamin E (OMEGA-3 COMPLEX PO) Take 1,280 mg by mouth daily.    Marland Kitchen doxycycline (PERIOSTAT) 20 MG tablet Take 20 mg by mouth 2 (two) times daily.    . Folic Acid (FOLATE PO) Take 400 mcg by mouth daily.    . Magnesium 400 MG CAPS Take 800 mg by mouth daily.     . milk thistle 175 MG tablet Take 350 mg by mouth daily.    . Misc Natural Products (JOINT HEALTH PO) Take 250 mg by mouth daily.    . SYMBICORT 80-4.5 MCG/ACT inhaler Inhale 1 puff into the lungs 2 (two) times daily.    . TURMERIC PO Take 2,000 mg by mouth daily.      No current facility-administered medications on file prior to visit.    There are no Patient Instructions on file for this visit. No follow-ups on file.   Georgiana Spinner, NP

## 2020-02-12 ENCOUNTER — Other Ambulatory Visit: Payer: Self-pay | Admitting: Pulmonary Disease

## 2020-02-16 ENCOUNTER — Telehealth: Payer: Self-pay | Admitting: Pulmonary Disease

## 2020-02-16 NOTE — Telephone Encounter (Signed)
Called and spoke to pt, who stated that Symbicort now has a 500 dollar copay. I have recommended that pt contact insurance for a medication formulary. Advised pt to contact our office once he has obtained formulary.  Will await call back.

## 2020-02-18 NOTE — Telephone Encounter (Signed)
Spoke to pt for update. Pt stated that he is still in process of obtaining this information.  Pt will call with update.

## 2020-02-24 NOTE — Telephone Encounter (Signed)
Spoke to pt, who stated that he spoke for [employer] and was advised that symbicort is the best priced with his plan.  Pt stated that he would like continue with symbicort, as he did some research and the alternatives to symbicort are a bit more expensive.  Will close encounter, as nothing further is needed at this time.

## 2020-02-24 NOTE — Telephone Encounter (Signed)
Lm for pt

## 2020-02-24 NOTE — Telephone Encounter (Signed)
Pt returning missed call. Can be reached at [phone]

## 2020-03-15 ENCOUNTER — Encounter: Payer: Self-pay | Admitting: Internal Medicine

## 2020-03-15 ENCOUNTER — Other Ambulatory Visit: Payer: Self-pay

## 2020-03-15 ENCOUNTER — Ambulatory Visit (INDEPENDENT_AMBULATORY_CARE_PROVIDER_SITE_OTHER): Payer: Medicare Other | Admitting: Internal Medicine

## 2020-03-15 VITALS — BP 150/80 | HR 77 | Ht 73.0 in | Wt 193.5 lb

## 2020-03-15 DIAGNOSIS — M25561 Pain in right knee: Secondary | ICD-10-CM

## 2020-03-15 DIAGNOSIS — I723 Aneurysm of iliac artery: Secondary | ICD-10-CM | POA: Diagnosis not present

## 2020-03-15 DIAGNOSIS — I10 Essential (primary) hypertension: Secondary | ICD-10-CM

## 2020-03-15 DIAGNOSIS — I712 Thoracic aortic aneurysm, without rupture, unspecified: Secondary | ICD-10-CM

## 2020-03-15 DIAGNOSIS — G8929 Other chronic pain: Secondary | ICD-10-CM

## 2020-03-15 DIAGNOSIS — I351 Nonrheumatic aortic (valve) insufficiency: Secondary | ICD-10-CM

## 2020-03-15 DIAGNOSIS — M25562 Pain in left knee: Secondary | ICD-10-CM

## 2020-03-15 MED ORDER — LOSARTAN POTASSIUM 25 MG PO TABS: 12.5000 mg | ORAL_TABLET | Freq: Every day | ORAL | 1 refills | Status: DC

## 2020-03-15 NOTE — Progress Notes (Signed)
Follow-up Outpatient Visit Date: 03/15/2020  Primary Care Provider: Venita Lick, NP [place] [address]  Chief Complaint: Follow-up aortic regurgitation and thoracic aortic aneurysm  HPI:  Mr. Saber is a 69 y.o. male with history of aortic regurgitation, iliac artery aneurysm, asthma, recurrent nasal polyps, borderline obstructive sleep apnea, arthritis, and venous insufficiency, who presents for follow-up of aortic regurgitation.  I met him about 6 months ago after he had moved from [place] to the [place] area.  He was doing well.  CT of the chest, abdomen, and pelvis in February was notable for mild dilation of the aortic root (4.2 cm) and a sending aorta (3.9 cm).  Left common iliac aneurysm was also noted, which was subsequently repaired endovascularly by vascular surgery in April.  Today, Mr. Thelen reports feeling well other than continued knee pain.  He is awaiting further orthopedics evaluation and hopes to undergo knee replacement before the Claudean Leavelle of the year (he ideally would like to have both knees replaced at the same time but is not optimistic about the possibility for this).  Despite his knee issues, he continues to walk and do strenuous activities around the house.  He denies chest pain, shortness of breath, palpitations, lightheadedness and edema.  He notes that his blood pressure at home is typically a little better than in the office today.  He has recovered from his aortoiliac intervention earlier this year.  --------------------------------------------------------------------------------------------------  Cardiovascular History & Procedures: Cardiovascular Problems:  Aortic regurgitation  Thoracic aortic aneurysm  Left common iliac artery aneurysm  Risk Factors:  Male gender and age > 15  Cath/PCI:  None  CV Surgery:  Left iliac artery aneurysm repair (11/2019)  EP Procedures and Devices:  None  Non-Invasive  Evaluation(s):  CTA chest (10/11/19): Mild dilation of the aortic root (4.2 cm) and mid ascending aorta (3.9 cm).  2.9 x 2.3 cm left common iliac artery aneurysm.  Multiple renal cysts.  Right  Scrotal hydrocele.  TTE (09/28/19): Normal LV size and wall thickness with LVEF 55-60%.  Normal diastolic function.  Normal RV size and function.  Mild LA enlargement.  MAC with mild to moderate MR.  Tricuspid aortic valve with moderate regurgitation.  Mildly dilated aortic root and transverse aorta.  Recent CV Pertinent Labs: Lab Results  Component Value Date   CHOL 152 01/03/2020   HDL 49 01/03/2020   LDLCALC 89 01/03/2020   TRIG 69 01/03/2020   INR 1.1 12/06/2019   K 4.0 12/09/2019   MG 2.1 12/09/2019   BUN 13 12/09/2019   BUN 23 08/09/2019   CREATININE 0.77 12/09/2019    Past medical and surgical history were reviewed and updated in EPIC.  Current Meds  Medication Sig  . Ascorbic Acid (VITAMIN C) 1000 MG tablet Take 1,000 mg by mouth daily.  Marland Kitchen aspirin EC 81 MG EC tablet Take 1 tablet (81 mg total) by mouth daily at 6 (six) AM.  . budesonide (PULMICORT) 0.5 MG/2ML nebulizer solution Take 0.5 mg by nebulization 2 (two) times daily.   . Cholecalciferol (VITAMIN D3) 125 MCG (5000 UT) CAPS Take 5,000 Units by mouth daily.   . clopidogrel (PLAVIX) 75 MG tablet Take 75 mg by mouth daily.  . cyanocobalamin 1000 MCG tablet Take 1,000 mcg by mouth daily.   . DHA-EPA-Vitamin E (OMEGA-3 COMPLEX PO) Take 1,280 mg by mouth daily.  Marland Kitchen doxycycline (PERIOSTAT) 20 MG tablet Take 20 mg by mouth 2 (two) times daily.  . Folic Acid (  FOLATE PO) Take 400 mcg by mouth daily.  . Magnesium 400 MG CAPS Take 800 mg by mouth daily.   . milk thistle 175 MG tablet Take 350 mg by mouth daily.  . Misc Natural Products (JOINT HEALTH PO) Take 250 mg by mouth daily.  . SYMBICORT 80-4.5 MCG/ACT inhaler INHALE 2 PUFFS DAILY  . TURMERIC PO Take 2,000 mg by mouth daily.     Allergies: Sulfur and Penicillins  Social History    Tobacco Use  . Smoking status: Never Smoker  . Smokeless tobacco: Never Used  Vaping Use  . Vaping Use: Never used  Substance Use Topics  . Alcohol use: Yes    Alcohol/week: 2.0 standard drinks    Types: 1 Glasses of wine, 1 Cans of beer per week    Comment: monthly  . Drug use: Never    Family History  Problem Relation Age of Onset  . Rheum arthritis Mother   . Cancer Father        liver  . Arthritis Brother   . Cancer Paternal Grandfather        throat  . Cancer Brother        liver  . Heart disease Half-Brother        Pacemaker/ICD  . Heart failure Half-Brother   . Colon cancer Paternal Aunt   . Colon cancer Paternal Uncle     Review of Systems: A 12-system review of systems was performed and was negative except as noted in the HPI.  --------------------------------------------------------------------------------------------------  Physical Exam: BP (!) 150/80 (BP Location: Left Arm, Patient Position: Sitting, Cuff Size: Normal)   Pulse 77   Ht _0  (1.854 m)   Wt 193 lb 8 oz (87.8 kg)   SpO2 95%   BMI 25.53 kg/m   General:  NAD. Neck: No JVD or HJR. Lungs: CTA bilaterally. Heart: RRR with 2/6 systolic and early diastolic murmurs. Abd: Soft, NT/ND. Extremities: No LE edema.  EKG:  NSR with PVC's.  Otherwise no significant abnormality.  Lab Results  Component Value Date   WBC 10.2 12/09/2019   HGB 13.3 12/09/2019   HCT 38.1 (L) 12/09/2019   MCV 92.5 12/09/2019   PLT 162 12/09/2019    Lab Results  Component Value Date   NA 138 12/09/2019   K 4.0 12/09/2019   CL 105 12/09/2019   CO2 25 12/09/2019   BUN 13 12/09/2019   CREATININE 0.77 12/09/2019   GLUCOSE 117 (H) 12/09/2019   ALT 34 08/09/2019    Lab Results  Component Value Date   CHOL 152 01/03/2020   HDL 49 01/03/2020   LDLCALC 89 01/03/2020   TRIG 69 01/03/2020    --------------------------------------------------------------------------------------------------  ASSESSMENT  AND PLAN: Aortic regurgitation and thoracic aortic aneurysm: Asymptomatic, with moderate aortic regurgitation and mildly dilated aortic root/ascending aorta noted this winter.  We will continue close monitoring with repeat echo and CTA chest in 09/2020.  No evidence of heart failure at this time.  We will work on improved blood pressure control, adding losartan 12.5 mg daily.  We discussed statin therapy as well, given significant PAD, though Mr. Violett wishes to defer this for now.  If possible fluoroquinolone antibiotics should be avoided.  Iliac artery aneurysm: Status post endovascular repair in April.  Continue follow-up with vascular surgery, as well as secondary prevention with blood pressure and lipid control.  Continue DAPT under the direction of vascular surgery.  Hypertension: Blood pressure mildly elevated at last several provider visit.  We  have agreed to start losartan 12.5 mg daily, with BMP in ~2 weeks.  Knee pain: Mr. Schweiss is awaiting further orthopedics evaluation and hopes to have at least one knee replaced before the Zhuri Krass of this year.  He does not report any symptoms to suggest unstable cardiac disease and is able to perform > 4 METS of activity without limitations.  I think it would be reasonable for him to proceed with orthopedic intervention without additional cardiac testing or intervention.  Management of antiplatelet therapy in the perioperative period will need to be addressed with vascular surgery.  Follow-up: Return to clinic in 3 months.  Nelva Bush, MD 03/15/2020 3:55 PM

## 2020-03-15 NOTE — Patient Instructions (Signed)
Medication Instructions:  Your physician has recommended you make the following change in your medication:   START Losartan 12.5 mg (1/2 tablet) daily. An Rx has been sent to your pharmacy.  *If you need a refill on your cardiac medications before your next appointment, please call your pharmacy*   Lab Work: Your physician recommends that you return for lab work in: 2 weeks  Please have your lab drawn at the [facility] medical mall. Lab hours are Mon-Fri 7:30am-6pm  If you have labs (blood work) drawn today and your tests are completely normal, you will receive your results only by: Marland Kitchen MyChart Message (if you have MyChart) OR . A paper copy in the mail If you have any lab test that is abnormal or we need to change your treatment, we will call you to review the results.   Testing/Procedures: None ordered   Follow-Up: At [facility], you and your health needs are our priority.  As part of our continuing mission to provide you with exceptional heart care, we have created designated Provider Care Teams.  These Care Teams include your primary Cardiologist (physician) and Advanced Practice Providers (APPs -  Physician Assistants and Nurse Practitioners) who all work together to provide you with the care you need, when you need it.  We recommend signing up for the patient portal called "MyChart".  Sign up information is provided on this After Visit Summary.  MyChart is used to connect with patients for Virtual Visits (Telemedicine).  Patients are able to view lab/test results, encounter notes, upcoming appointments, etc.  Non-urgent messages can be sent to your provider as well.   To learn more about what you can do with MyChart, go to [email or web address].    Your next appointment:   3 month(s)  The format for your next appointment:   In Person  Provider:    You may see Dr. Okey Dupre or one of the following Advanced Practice Providers on your designated Care Team:    Nicolasa Ducking,  NP  Eula Listen, PA-C  Marisue Ivan, PA-C    Other Instructions N/A

## 2020-03-17 ENCOUNTER — Encounter: Payer: Self-pay | Admitting: Internal Medicine

## 2020-03-17 DIAGNOSIS — I712 Thoracic aortic aneurysm, without rupture, unspecified: Secondary | ICD-10-CM | POA: Insufficient documentation

## 2020-03-17 DIAGNOSIS — I10 Essential (primary) hypertension: Secondary | ICD-10-CM | POA: Insufficient documentation

## 2020-03-17 DIAGNOSIS — M25562 Pain in left knee: Secondary | ICD-10-CM | POA: Insufficient documentation

## 2020-03-17 DIAGNOSIS — G8929 Other chronic pain: Secondary | ICD-10-CM | POA: Insufficient documentation

## 2020-04-07 ENCOUNTER — Ambulatory Visit (INDEPENDENT_AMBULATORY_CARE_PROVIDER_SITE_OTHER): Payer: Medicare Other | Admitting: Nurse Practitioner

## 2020-04-07 ENCOUNTER — Other Ambulatory Visit: Payer: Self-pay

## 2020-04-07 ENCOUNTER — Encounter: Payer: Self-pay | Admitting: Nurse Practitioner

## 2020-04-07 VITALS — BP 129/60 | HR 78 | Temp 98.4°F

## 2020-04-07 DIAGNOSIS — R030 Elevated blood-pressure reading, without diagnosis of hypertension: Secondary | ICD-10-CM | POA: Diagnosis not present

## 2020-04-07 DIAGNOSIS — I723 Aneurysm of iliac artery: Secondary | ICD-10-CM | POA: Diagnosis not present

## 2020-04-07 DIAGNOSIS — I351 Nonrheumatic aortic (valve) insufficiency: Secondary | ICD-10-CM

## 2020-04-07 DIAGNOSIS — G4733 Obstructive sleep apnea (adult) (pediatric): Secondary | ICD-10-CM

## 2020-04-07 DIAGNOSIS — J449 Chronic obstructive pulmonary disease, unspecified: Secondary | ICD-10-CM | POA: Diagnosis not present

## 2020-04-07 NOTE — Progress Notes (Signed)
BP 129/60    Pulse 78    Temp 98.4 F (36.9 C) (Oral)    SpO2 97%    Subjective:    Patient ID: Richard Wolfe, male    DOB: [birth year], 69 y.o.   MRN: [id number]  HPI: Richard Wolfe is a 69 y.o. male  Chief Complaint  Patient presents with   Hypertension   COPD   AORTIC INSUFFICIENCY: Saw Dr. Okey Dupre with cardiology on 03/15/20 -- to have repeat echo and CTA February 2022.  Losartan 12.5 MG was started at recent visit with cardiology, however he declined statin initiation -- reports he has not been taking this, has been monitoring BP daily for 2 weeks with levels 104/59 to 136/64 (majority <130/80) -- HR 60 to 80 range.  Was followed by cardiology in [place] and told to continue this.  Had echo in February 2021 with EF 55-60%.  He is being followed by Dr. Gilda Crease for iliac artery aneurysm, had repair and stent placed 12/08/19.  Last vascular visit on 01/20/20 -- is to continue DAPT. Aspirin:yes Recurrent headaches:no Visual changes:no Palpitations:no Dyspnea:no Chest pain:no Lower extremity edema:no Dizzy/lightheaded:no The 10-year ASCVD risk score Denman George DC Jr., et al., 2013) is: 16.2%   Values used to calculate the score:     Age: 66 years     Sex: Male     Is Non-Hispanic African American: No     Diabetic: No     Tobacco smoker: No     Systolic Blood Pressure: 129 mmHg     Is BP treated: No     HDL Cholesterol: 49 mg/dL     Total Cholesterol: 152 mg/dL   ASTHMA, MILD, INTERMITTENT Saw pulmonary locally 10/21/2019.  Was followed by pulmonary in [place] for asthma. Has had this since childhood. Uses Symbicort inhaler daily. Has Albuterol inhaler, but rarely uses. States he fills it once a year, but only uses once or twice a year.  Has not been using nebulizer.  Saw ENT 02/14/20.  Does not use CPAP, has not used in 1 1/2 to 2 years -- is going to start using this due to benefits. Is aware he would benefit from daily use of this. Asthma  status:stable Satisfied with current treatment?:yes Albuterol/rescue inhaler frequency:once or twice a year Dyspnea frequency:none Wheezing frequency:none Cough frequency:none Nocturnal symptom frequency:none Limitation of activity:no Current upper respiratory symptoms:no Triggers: exercise Home peak flows:none Last Spirometry:  Failed/intolerant to following asthma meds:  Asthma meds in past:  Aerochamber/spacer use:no Visits to ER or Urgent Care in past year:no Pneumovax:Up to Date Influenza:Up to Date  Relevant past medical, surgical, family and social history reviewed and updated as indicated. Interim medical history since our last visit reviewed. Allergies and medications reviewed and updated.  Review of Systems  Constitutional: Negative for activity change, diaphoresis, fatigue and fever.  Respiratory: Negative for cough, chest tightness, shortness of breath and wheezing.   Cardiovascular: Negative for chest pain, palpitations and leg swelling.  Gastrointestinal: Negative.   Skin: Negative.   Neurological: Negative.   Psychiatric/Behavioral: Negative.     Per HPI unless specifically indicated above     Objective:    BP 129/60    Pulse 78    Temp 98.4 F (36.9 C) (Oral)    SpO2 97%   Wt Readings from Last 3 Encounters:  03/15/20 193 lb 8 oz (87.8 kg)  01/20/20 193 lb (87.5 kg)  01/03/20 190 lb 12.8 oz (86.5 kg)    Physical Exam Vitals and nursing note  reviewed.  Constitutional:      General: He is awake. He is not in acute distress.    Appearance: He is well-developed. He is not ill-appearing.  HENT:     Head: Normocephalic and atraumatic.     Right Ear: Hearing normal. No drainage.     Left Ear: Hearing normal. No drainage.  Eyes:     General: Lids are normal.        Right eye: No discharge.        Left eye: No discharge.     Conjunctiva/sclera: Conjunctivae normal.     Pupils: Pupils are equal, round, and reactive to light.  Neck:      Thyroid: No thyromegaly.     Vascular: No carotid bruit.  Cardiovascular:     Rate and Rhythm: Normal rate and regular rhythm.     Heart sounds: S1 normal and S2 normal. Murmur heard.  Systolic murmur is present with a grade of 1/6.  No gallop.   Pulmonary:     Effort: Pulmonary effort is normal. No accessory muscle usage or respiratory distress.     Breath sounds: Normal breath sounds.     Comments: Slight pectus excavatum noted. Abdominal:     General: Bowel sounds are normal.     Palpations: Abdomen is soft.  Musculoskeletal:        General: Normal range of motion.     Cervical back: Normal range of motion and neck supple.     Right lower leg: No edema.     Left lower leg: No edema.     Comments: Knee compression sleeve to left knee present  Skin:    General: Skin is warm and dry.  Neurological:     Mental Status: He is alert and oriented to person, place, and time.  Psychiatric:        Attention and Perception: Attention normal.        Mood and Affect: Mood normal.        Speech: Speech normal.        Behavior: Behavior normal. Behavior is cooperative.        Thought Content: Thought content normal.    Results for orders placed or performed in visit on 01/03/20  Bayer DCA Hb A1c Waived  Result Value Ref Range   HB A1C (BAYER DCA - WAIVED) 5.2 <7.0 %  Lipid Panel w/o Chol/HDL Ratio  Result Value Ref Range   Cholesterol, Total 152 100 - 199 mg/dL   Triglycerides 69 0 - 149 mg/dL   HDL 49 >73 mg/dL   VLDL Cholesterol Cal 14 5 - 40 mg/dL   LDL Chol Calc (NIH) 89 0 - 99 mg/dL  Microalbumin, Urine Waived  Result Value Ref Range   Microalb, Ur Waived 30 (H) 0 - 19 mg/L   Creatinine, Urine Waived 200 10 - 300 mg/dL   Microalb/Creat Ratio <30 <30 mg/g  PSA  Result Value Ref Range   Prostate Specific Ag, Serum 0.2 0.0 - 4.0 ng/mL  TSH  Result Value Ref Range   TSH 3.250 0.450 - 4.500 uIU/mL  Urinalysis, Routine w reflex microscopic  Result Value Ref Range   Specific  Gravity, UA >1.030 (H) 1.005 - 1.030   pH, UA 5.0 5.0 - 7.5   Color, UA Yellow Yellow   Appearance Ur Clear Clear   Leukocytes,UA Negative Negative   Protein,UA Negative Negative/Trace   Glucose, UA Negative Negative   Ketones, UA Trace (A) Negative   RBC, UA  Negative Negative   Bilirubin, UA Negative Negative   Urobilinogen, Ur 0.2 0.2 - 1.0 mg/dL   Nitrite, UA Negative Negative  VITAMIN D 25 Hydroxy (Vit-D Deficiency, Fractures)  Result Value Ref Range   Vit D, 25-Hydroxy 47.1 30.0 - 100.0 ng/mL  B12  Result Value Ref Range   Vitamin B-12 >2000 (H) 232 - 1245 pg/mL  Hepatitis C Antibody  Result Value Ref Range   Hep C Virus Ab <0.1 0.0 - 0.9 s/co ratio      Assessment & Plan:   Problem List Items Addressed This Visit      Cardiovascular and Mediastinum   White coat syndrome without diagnosis of hypertension    Stable today with BP at goal and home BP's at goal.  Recommend checking BP daily at home in morning and evening.  No current medications (he did not start Losartan as ordered by cardiology), but will consider if consistent elevations.  Continue collaboration with cardiology.  Labs next visit.  Return in 6 months.      Aortic insufficiency    Continue collaboration with cardiology, recent note reviewed.      Iliac artery aneurysm, left [facility]) - Primary    Surgery in April, stent.  Continue DAPT per vascular instruction and collaboration with vascular.  Recent note reviewed.        Respiratory   COPD with asthma (HCC)    Chronic, ongoing.  Continue current medication regimen and adjust as needed.  Continue collaboration with pulmonary and ENT, notes reviewed.      OSA (obstructive sleep apnea)    Has not used CPAP in 1 1/2 to 2 years.  Have recommended he return to use of CPAP regularly for benefit to overall health.          Follow up plan: Return in about 6 months (around 10/08/2020) for HTN/HLD, COPD -- 40 minute slot.

## 2020-04-07 NOTE — Assessment & Plan Note (Signed)
Continue collaboration with cardiology, recent note reviewed. 

## 2020-04-07 NOTE — Patient Instructions (Signed)
DASH Eating Plan DASH stands for "Dietary Approaches to Stop Hypertension." The DASH eating plan is a healthy eating plan that has been shown to reduce high blood pressure (hypertension). It may also reduce your risk for type 2 diabetes, heart disease, and stroke. The DASH eating plan may also help with weight loss. What are tips for following this plan?  General guidelines  Avoid eating more than 2,300 mg (milligrams) of salt (sodium) a day. If you have hypertension, you may need to reduce your sodium intake to 1,500 mg a day.  Limit alcohol intake to no more than 1 drink a day for nonpregnant women and 2 drinks a day for men. One drink equals 12 oz of beer, 5 oz of wine, or 1 oz of hard liquor.  Work with your health care provider to maintain a healthy body weight or to lose weight. Ask what an ideal weight is for you.  Get at least 30 minutes of exercise that causes your heart to beat faster (aerobic exercise) most days of the week. Activities may include walking, swimming, or biking.  Work with your health care provider or diet and nutrition specialist (dietitian) to adjust your eating plan to your individual calorie needs. Reading food labels   Check food labels for the amount of sodium per serving. Choose foods with less than 5 percent of the Daily Value of sodium. Generally, foods with less than 300 mg of sodium per serving fit into this eating plan.  To find whole grains, look for the word "whole" as the first word in the ingredient list. Shopping  Buy products labeled as "low-sodium" or "no salt added."  Buy fresh foods. Avoid canned foods and premade or frozen meals. Cooking  Avoid adding salt when cooking. Use salt-free seasonings or herbs instead of table salt or sea salt. Check with your health care provider or pharmacist before using salt substitutes.  Do not fry foods. Cook foods using healthy methods such as baking, boiling, grilling, and broiling instead.  Cook with  heart-healthy oils, such as olive, canola, soybean, or sunflower oil. Meal planning  Eat a balanced diet that includes: ? 5 or more servings of fruits and vegetables each day. At each meal, try to fill half of your plate with fruits and vegetables. ? Up to 6-8 servings of whole grains each day. ? Less than 6 oz of lean meat, poultry, or fish each day. A 3-oz serving of meat is about the same size as a deck of cards. One egg equals 1 oz. ? 2 servings of low-fat dairy each day. ? A serving of nuts, seeds, or beans 5 times each week. ? Heart-healthy fats. Healthy fats called Omega-3 fatty acids are found in foods such as flaxseeds and coldwater fish, like sardines, salmon, and mackerel.  Limit how much you eat of the following: ? Canned or prepackaged foods. ? Food that is high in trans fat, such as fried foods. ? Food that is high in saturated fat, such as fatty meat. ? Sweets, desserts, sugary drinks, and other foods with added sugar. ? Full-fat dairy products.  Do not salt foods before eating.  Try to eat at least 2 vegetarian meals each week.  Eat more home-cooked food and less restaurant, buffet, and fast food.  When eating at a restaurant, ask that your food be prepared with less salt or no salt, if possible. What foods are recommended? The items listed may not be a complete list. Talk with your dietitian about   what dietary choices are best for you. Grains Whole-grain or whole-wheat bread. Whole-grain or whole-wheat pasta. Brown rice. Oatmeal. Quinoa. Bulgur. Whole-grain and low-sodium cereals. Pita bread. Low-fat, low-sodium crackers. Whole-wheat flour tortillas. Vegetables Fresh or frozen vegetables (raw, steamed, roasted, or grilled). Low-sodium or reduced-sodium tomato and vegetable juice. Low-sodium or reduced-sodium tomato sauce and tomato paste. Low-sodium or reduced-sodium canned vegetables. Fruits All fresh, dried, or frozen fruit. Canned fruit in natural juice (without  added sugar). Meat and other protein foods Skinless chicken or turkey. Ground chicken or turkey. Pork with fat trimmed off. Fish and seafood. Egg whites. Dried beans, peas, or lentils. Unsalted nuts, nut butters, and seeds. Unsalted canned beans. Lean cuts of beef with fat trimmed off. Low-sodium, lean deli meat. Dairy Low-fat (1%) or fat-free (skim) milk. Fat-free, low-fat, or reduced-fat cheeses. Nonfat, low-sodium ricotta or cottage cheese. Low-fat or nonfat yogurt. Low-fat, low-sodium cheese. Fats and oils Soft margarine without trans fats. Vegetable oil. Low-fat, reduced-fat, or light mayonnaise and salad dressings (reduced-sodium). Canola, safflower, olive, soybean, and sunflower oils. Avocado. Seasoning and other foods Herbs. Spices. Seasoning mixes without salt. Unsalted popcorn and pretzels. Fat-free sweets. What foods are not recommended? The items listed may not be a complete list. Talk with your dietitian about what dietary choices are best for you. Grains Baked goods made with fat, such as croissants, muffins, or some breads. Dry pasta or rice meal packs. Vegetables Creamed or fried vegetables. Vegetables in a cheese sauce. Regular canned vegetables (not low-sodium or reduced-sodium). Regular canned tomato sauce and paste (not low-sodium or reduced-sodium). Regular tomato and vegetable juice (not low-sodium or reduced-sodium). Pickles. Olives. Fruits Canned fruit in a light or heavy syrup. Fried fruit. Fruit in cream or butter sauce. Meat and other protein foods Fatty cuts of meat. Ribs. Fried meat. Bacon. Sausage. Bologna and other processed lunch meats. Salami. Fatback. Hotdogs. Bratwurst. Salted nuts and seeds. Canned beans with added salt. Canned or smoked fish. Whole eggs or egg yolks. Chicken or turkey with skin. Dairy Whole or 2% milk, cream, and half-and-half. Whole or full-fat cream cheese. Whole-fat or sweetened yogurt. Full-fat cheese. Nondairy creamers. Whipped toppings.  Processed cheese and cheese spreads. Fats and oils Butter. Stick margarine. Lard. Shortening. Ghee. Bacon fat. Tropical oils, such as coconut, palm kernel, or palm oil. Seasoning and other foods Salted popcorn and pretzels. Onion salt, garlic salt, seasoned salt, table salt, and sea salt. Worcestershire sauce. Tartar sauce. Barbecue sauce. Teriyaki sauce. Soy sauce, including reduced-sodium. Steak sauce. Canned and packaged gravies. Fish sauce. Oyster sauce. Cocktail sauce. Horseradish that you find on the shelf. Ketchup. Mustard. Meat flavorings and tenderizers. Bouillon cubes. Hot sauce and Tabasco sauce. Premade or packaged marinades. Premade or packaged taco seasonings. Relishes. Regular salad dressings. Where to find more information:  National Heart, Lung, and Blood Institute: www.nhlbi.nih.gov  American Heart Association: www.heart.org Summary  The DASH eating plan is a healthy eating plan that has been shown to reduce high blood pressure (hypertension). It may also reduce your risk for type 2 diabetes, heart disease, and stroke.  With the DASH eating plan, you should limit salt (sodium) intake to 2,300 mg a day. If you have hypertension, you may need to reduce your sodium intake to 1,500 mg a day.  When on the DASH eating plan, aim to eat more fresh fruits and vegetables, whole grains, lean proteins, low-fat dairy, and heart-healthy fats.  Work with your health care provider or diet and nutrition specialist (dietitian) to adjust your eating plan to your   individual calorie needs. This information is not intended to replace advice given to you by your health care provider. Make sure you discuss any questions you have with your health care provider. Document Revised: 07/25/2017 Document Reviewed: 08/05/2016 Elsevier Patient Education  2020 Elsevier Inc.  

## 2020-04-07 NOTE — Assessment & Plan Note (Signed)
Stable today with BP at goal and home BP's at goal.  Recommend checking BP daily at home in morning and evening.  No current medications (he did not start Losartan as ordered by cardiology), but will consider if consistent elevations.  Continue collaboration with cardiology.  Labs next visit.  Return in 6 months.

## 2020-04-07 NOTE — Assessment & Plan Note (Signed)
Surgery in April, stent.  Continue DAPT per vascular instruction and collaboration with vascular.  Recent note reviewed.

## 2020-04-07 NOTE — Assessment & Plan Note (Signed)
Chronic, ongoing.  Continue current medication regimen and adjust as needed.  Continue collaboration with pulmonary and ENT, notes reviewed. 

## 2020-04-07 NOTE — Assessment & Plan Note (Signed)
Has not used CPAP in 1 1/2 to 2 years.  Have recommended he return to use of CPAP regularly for benefit to overall health. 

## 2020-05-15 ENCOUNTER — Encounter (INDEPENDENT_AMBULATORY_CARE_PROVIDER_SITE_OTHER): Payer: Self-pay

## 2020-06-04 ENCOUNTER — Other Ambulatory Visit (INDEPENDENT_AMBULATORY_CARE_PROVIDER_SITE_OTHER): Payer: Self-pay | Admitting: Vascular Surgery

## 2020-06-21 ENCOUNTER — Ambulatory Visit: Payer: Medicare Other | Admitting: Internal Medicine

## 2020-07-11 ENCOUNTER — Other Ambulatory Visit (INDEPENDENT_AMBULATORY_CARE_PROVIDER_SITE_OTHER): Payer: Self-pay | Admitting: Vascular Surgery

## 2020-07-11 DIAGNOSIS — Z9889 Other specified postprocedural states: Secondary | ICD-10-CM

## 2020-07-11 DIAGNOSIS — Z8679 Personal history of other diseases of the circulatory system: Secondary | ICD-10-CM

## 2020-07-11 DIAGNOSIS — I83812 Varicose veins of left lower extremities with pain: Secondary | ICD-10-CM

## 2020-07-13 ENCOUNTER — Ambulatory Visit (INDEPENDENT_AMBULATORY_CARE_PROVIDER_SITE_OTHER): Payer: Medicare Other

## 2020-07-13 ENCOUNTER — Other Ambulatory Visit: Payer: Self-pay

## 2020-07-13 ENCOUNTER — Encounter (INDEPENDENT_AMBULATORY_CARE_PROVIDER_SITE_OTHER): Payer: Self-pay | Admitting: Nurse Practitioner

## 2020-07-13 ENCOUNTER — Ambulatory Visit (INDEPENDENT_AMBULATORY_CARE_PROVIDER_SITE_OTHER): Payer: Medicare Other | Admitting: Nurse Practitioner

## 2020-07-13 VITALS — BP 165/75 | HR 73 | Resp 16 | Wt 192.2 lb

## 2020-07-13 DIAGNOSIS — Z8679 Personal history of other diseases of the circulatory system: Secondary | ICD-10-CM

## 2020-07-13 DIAGNOSIS — I723 Aneurysm of iliac artery: Secondary | ICD-10-CM

## 2020-07-13 DIAGNOSIS — R030 Elevated blood-pressure reading, without diagnosis of hypertension: Secondary | ICD-10-CM | POA: Diagnosis not present

## 2020-07-13 DIAGNOSIS — M170 Bilateral primary osteoarthritis of knee: Secondary | ICD-10-CM

## 2020-07-13 DIAGNOSIS — Z9889 Other specified postprocedural states: Secondary | ICD-10-CM

## 2020-07-17 ENCOUNTER — Encounter (INDEPENDENT_AMBULATORY_CARE_PROVIDER_SITE_OTHER): Payer: Self-pay | Admitting: Nurse Practitioner

## 2020-07-17 ENCOUNTER — Encounter (INDEPENDENT_AMBULATORY_CARE_PROVIDER_SITE_OTHER): Payer: Self-pay

## 2020-07-17 NOTE — Progress Notes (Signed)
,   Subjective:    Patient ID: Richard Wolfe, male    DOB: [birth year], 69 y.o.   MRN: [id number] Chief Complaint  Patient presents with  . Follow-up    25month ultrasound follow up    The patient returns to the office for surveillance of an abdominal aortic aneurysm status post stent graft placement on 12/08/2019.   Patient denies abdominal pain or back pain, no other abdominal complaints. No groin related complaints. No symptoms consistent with distal embolization No changes in claudication distance.   There have been no interval changes in his overall healthcare since his last visit.   Patient denies amaurosis fugax or TIA symptoms. There is no history of claudication or rest pain symptoms of the lower extremities. The patient denies angina or shortness of breath.   Duplex US of the aorta and iliac arteries shows a 2.9 AAA sac with no endoleak, no change in the sac compared to the previous study.   Review of Systems  Musculoskeletal: Positive for arthralgias.  All other systems reviewed and are negative.      Objective:   Physical Exam Vitals reviewed.  HENT:     Head: Normocephalic.  Cardiovascular:     Rate and Rhythm: Normal rate and regular rhythm.     Pulses: Normal pulses.  Pulmonary:     Effort: Pulmonary effort is normal.  Skin:    General: Skin is warm and dry.  Neurological:     Mental Status: He is alert and oriented to person, place, and time.  Psychiatric:        Mood and Affect: Mood normal.        Behavior: Behavior normal.        Thought Content: Thought content normal.        Judgment: Judgment normal.     BP (!) 165/75 (BP Location: Left Arm)   Pulse 73   Resp 16   Wt 192 lb 3.2 oz (87.2 kg)   BMI 25.36 kg/m   Past Medical History:  Diagnosis Date  . Aortic regurgitation   . Arthritis   . Asthma   . Hernia, inguinal, recurrent   . Nasal polyps   . Pneumonia   . PONV (postoperative nausea and vomiting)     Social History    Socioeconomic History  . Marital status: Married    Spouse name: Not on file  . Number of children: Not on file  . Years of education: Not on file  . Highest education level: Not on file  Occupational History  . Not on file  Tobacco Use  . Smoking status: Never Smoker  . Smokeless tobacco: Never Used  Vaping Use  . Vaping Use: Never used  Substance and Sexual Activity  . Alcohol use: Yes    Alcohol/week: 2.0 standard drinks    Types: 1 Glasses of wine, 1 Cans of beer per week    Comment: monthly  . Drug use: Never  . Sexual activity: Yes  Other Topics Concern  . Not on file  Social History Narrative  . Not on file   Social Determinants of Health   Financial Resource Strain: Low Risk   . Difficulty of Paying Living Expenses: Not hard at all  Food Insecurity: No Food Insecurity  . Worried About Programme researcher, broadcasting/film/video in the Last Year: Never true  . Ran Out of Food in the Last Year: Never true  Transportation Needs: No Transportation Needs  . Lack of Transportation (Medical): No  .  Lack of Transportation (Non-Medical): No  Physical Activity: Sufficiently Active  . Days of Exercise per Week: 5 days  . Minutes of Exercise per Session: 30 min  Stress: No Stress Concern Present  . Feeling of Stress : Not at all  Social Connections: Moderately Isolated  . Frequency of Communication with Friends and Family: More than three times a week  . Frequency of Social Gatherings with Friends and Family: More than three times a week  . Attends Religious Services: Never  . Active Member of Clubs or Organizations: No  . Attends Banker Meetings: Never  . Marital Status: Married  Catering manager Violence: Not At Risk  . Fear of Current or Ex-Partner: No  . Emotionally Abused: No  . Physically Abused: No  . Sexually Abused: No    Past Surgical History:  Procedure Laterality Date  . CATARACT EXTRACTION W/ INTRAOCULAR LENS IMPLANT Left 10/19/2008  . CATARACT EXTRACTION  W/ INTRAOCULAR LENS IMPLANT Right 11/09/2008  . ENDOVASCULAR REPAIR/STENT GRAFT N/A 12/08/2019   Procedure: ENDOVASCULAR REPAIR/STENT GRAFT;  Surgeon: Renford Dills, MD;  Location: [facility] INVASIVE CV [facility];  Service: Cardiovascular;  Laterality: N/A;  . EYE SURGERY    . INGUINAL HERNIA REPAIR Left    1999  . INGUINAL HERNIA REPAIR Right    2019  . NASAL POLYP EXCISION     1981, 1986, 1993, 1994, 1995, 1996, 1998, 1999, 2001  . NASAL SEPTUM SURGERY     1981  . REFRACTIVE SURGERY  2008  . trigeminal nerve block  2003  . TRIGEMINAL NERVE DECOMPRESSION  2002, 2003  . WISDOM TOOTH EXTRACTION  1967  . XI ROBOTIC ASSISTED INGUINAL HERNIA REPAIR WITH MESH Left 2019    Family History  Problem Relation Age of Onset  . Rheum arthritis Mother   . Cancer Father        liver  . Arthritis Brother   . Cancer Paternal Grandfather        throat  . Cancer Brother        liver  . Heart disease Half-Brother        Pacemaker/ICD  . Heart failure Half-Brother   . Colon cancer Paternal Aunt   . Colon cancer Paternal Uncle     Allergies  Allergen Reactions  . Sulfur Anaphylaxis  . Penicillins     Convulsions Did it involve swelling of the face/tongue/throat, SOB, or low BP? No Did it involve sudden or severe rash/hives, skin peeling, or any reaction on the inside of your mouth or nose? No Did you need to seek medical attention at a hospital or doctor's office? Yes When did it last happen? If all above answers are "NO", may proceed with cephalosporin use.     CBC Latest Ref Rng & Units 12/09/2019 12/06/2019 08/09/2019  WBC 4.0 - 10.5 K/uL 10.2 4.8 6.7  Hemoglobin 13.0 - 17.0 g/dL 57.2 62.0 35.5  Hematocrit 39 - 52 % 38.1(L) 45.9 48.0  Platelets 150 - 400 K/uL 162 183 202      CMP     Component Value Date/Time   NA 138 12/09/2019 0308   NA 140 08/09/2019 1425   K 4.0 12/09/2019 0308   CL 105 12/09/2019 0308   CO2 25 12/09/2019 0308   GLUCOSE 117 (H) 12/09/2019 0308   BUN  13 12/09/2019 0308   BUN 23 08/09/2019 1425   CREATININE 0.77 12/09/2019 0308   CALCIUM 8.7 (L) 12/09/2019 0308   PROT 7.6 08/09/2019 1425  ALBUMIN 4.3 08/09/2019 1425   AST 35 08/09/2019 1425   ALT 34 08/09/2019 1425   ALKPHOS 68 08/09/2019 1425   BILITOT 1.5 (H) 08/09/2019 1425   GFRNONAA >60 12/09/2019 0308   GFRAA >60 12/09/2019 0308     No results found.     Assessment & Plan:   1. Status post endovascular aneurysm repair (EVAR) Recommend: Patient is status post successful endovascular repair of the AAA.   No further intervention is required at this time.   No endoleak is detected and the aneurysm sac is stable.  The patient will continue antiplatelet therapy as prescribed as well as aggressive management of hyperlipidemia. Exercise is again strongly encouraged.   However, endografts require continued surveillance with ultrasound or CT scan. This is mandatory to detect any changes that allow repressurization of the aneurysm sac.  The patient is informed that this would be asymptomatic.  The patient is reminded that lifelong routine surveillance is a necessity with an endograft. Patient will continue to follow-up at 12 month intervals with ultrasound of the aorta.  2. Primary osteoarthritis of both knees The patient's most recent endovascular intervention is stable.  If the patient has upcoming surgery for his knee it would be low risk for him to stop anticoagulation for surgery.   3. Iliac artery aneurysm (HCC) No evidence of growth following endovascular repair.  We will continue to monitor with annual EVAR.  4. White coat syndrome without diagnosis of hypertension This may account for the patient's hypertension today.  Patient will continue to have routine follow-ups with PCP for monitoring and possible medication changes if necessary.   Current Outpatient Medications on File Prior to Visit  Medication Sig Dispense Refill  . Ascorbic Acid (VITAMIN C) 1000 MG tablet  Take 1,000 mg by mouth daily.    Marland Kitchen aspirin EC 81 MG EC tablet Take 1 tablet (81 mg total) by mouth daily at 6 (six) AM. 90 tablet 3  . budesonide (PULMICORT) 0.5 MG/2ML nebulizer solution Take 0.5 mg by nebulization 2 (two) times daily.     . Cholecalciferol (VITAMIN D3) 125 MCG (5000 UT) CAPS Take 5,000 Units by mouth daily.     . clopidogrel (PLAVIX) 75 MG tablet TAKE 1 TABLET (75 MG TOTAL) BY MOUTH DAILY AT 6 (SIX) AM. 90 tablet 1  . cyanocobalamin 1000 MCG tablet Take 1,000 mcg by mouth daily.     . DHA-EPA-Vitamin E (OMEGA-3 COMPLEX PO) Take 1,280 mg by mouth daily.    Marland Kitchen doxycycline (PERIOSTAT) 20 MG tablet Take 20 mg by mouth 2 (two) times daily.    . Folic Acid (FOLATE PO) Take 400 mcg by mouth daily.    . Magnesium 400 MG CAPS Take 800 mg by mouth daily.     . milk thistle 175 MG tablet Take 350 mg by mouth daily.    . Misc Natural Products (JOINT HEALTH PO) Take 250 mg by mouth daily.    . SYMBICORT 80-4.5 MCG/ACT inhaler INHALE 2 PUFFS DAILY 30.6 Inhaler 6  . TURMERIC PO Take 2,000 mg by mouth daily.      No current facility-administered medications on file prior to visit.    There are no Patient Instructions on file for this visit. No follow-ups on file.   Georgiana Spinner, NP

## 2020-07-19 ENCOUNTER — Telehealth: Payer: Self-pay

## 2020-07-19 NOTE — Telephone Encounter (Signed)
I spoke with patient today for pre-op risk assessment and notified him that I would send completed form to surgeon's office.  Corrin Parker, PA-C 07/19/2020 10:38 AM

## 2020-07-19 NOTE — Telephone Encounter (Signed)
Pre-op covering staff, can we please get a clearance form scanned in for this patient?  Thank you! Richard Wolfe

## 2020-07-19 NOTE — Telephone Encounter (Signed)
   [facility] Pre-operative Risk Assessment    HEARTCARE STAFF: - Please ensure there is not already an duplicate clearance open for this procedure. - Under Visit Info/Reason for Call, type in Other and utilize the format Clearance MM/DD/YY or Clearance TBD. Do not use dashes or single digits. - If request is for dental extraction, please clarify the # of teeth to be extracted.  Request for surgical clearance:  1. What type of surgery is being performed? Joint/ knee replacement  2. When is this surgery scheduled? TBD   3. What type of clearance is required (medical clearance vs. Pharmacy clearance to hold med vs. Both)? BOTH  4. Are there any medications that need to be held prior to surgery and how long? ASA, Plavix   5. Practice name and name of physician performing surgery? [facility], Dr. Legrand Como Bolognessi   6. What is the office phone number? [phone]   7.   What is the office fax number? [phone] Attn: Ladona Ridgel  8.   Anesthesia type (None, local, MAC, general) ? Not listed   Richard Wolfe 07/19/2020, 10:04 AM  _________________________________________________________________   (provider comments below)

## 2020-07-19 NOTE — Telephone Encounter (Signed)
° °  Primary Cardiologist: Dr. Okey Dupre  Chart reviewed as part of pre-operative protocol coverage. Patient was last seen by Dr. Okey Dupre in 02/2020 and was doing well from a cardiac standpoint at that time. He mentioned likely need for knee surgery and was felt to be at acceptable risk for procedure without any additional testing. Patient was contacted today for further pre-op evaluation and reported no changes since last visit. No chest pain, shortness of breath, orthopnea, PND, palpitations, dizziness, syncope. He is easily able to complete >4.0 METS without any problems. Given past medical history and time since last visit, based on ACC/AHA guidelines, Richard Wolfe would be at acceptable risk for the planned procedure without further cardiovascular testing.   Per Dr. Okey Dupre, "management of antiplatelet therapy in the perioperative period will need to be addressed by Vascular Surgery."  The patient was advised that if he develops new symptoms prior to surgery to contact our office to arrange for a follow-up visit, and he verbalized understanding.  I will route this recommendation to the requesting party via Epic fax function and remove from pre-op pool.  Please call with questions.  Corrin Parker, PA-C 07/19/2020, 10:32 AM

## 2020-08-28 ENCOUNTER — Telehealth: Payer: Self-pay | Admitting: Pulmonary Disease

## 2020-08-28 NOTE — Telephone Encounter (Signed)
pt is calling to get prescription for pulmicort. pt states that his past PU in [place] prescibed it for him and the refills have ran out. So he would like for VS to send one in. pt also states that he sent a mychart message but I didnt see it.  Please advise  [phone]

## 2020-08-28 NOTE — Telephone Encounter (Signed)
VS   Pt is calling to see if you would be willing to send in a refill of his pulmicort.  He is about to be out of this and he stated that his PU in [place] last refilled it but he is now out.  Pt was last seen on 10/21/2019 by VS.    Allergies  Allergen Reactions  . Sulfur Anaphylaxis  . Penicillins     Convulsions Did it involve swelling of the face/tongue/throat, SOB, or low BP? No Did it involve sudden or severe rash/hives, skin peeling, or any reaction on the inside of your mouth or nose? No Did you need to seek medical attention at a hospital or doctor's office? Yes When did it last happen? If all above answers are "NO", may proceed with cephalosporin use.     Current Outpatient Medications on File Prior to Visit  Medication Sig Dispense Refill  . Ascorbic Acid (VITAMIN C) 1000 MG tablet Take 1,000 mg by mouth daily.    Marland Kitchen aspirin EC 81 MG EC tablet Take 1 tablet (81 mg total) by mouth daily at 6 (six) AM. 90 tablet 3  . budesonide (PULMICORT) 0.5 MG/2ML nebulizer solution Take 0.5 mg by nebulization 2 (two) times daily.     . Cholecalciferol (VITAMIN D3) 125 MCG (5000 UT) CAPS Take 5,000 Units by mouth daily.     . clopidogrel (PLAVIX) 75 MG tablet TAKE 1 TABLET (75 MG TOTAL) BY MOUTH DAILY AT 6 (SIX) AM. 90 tablet 1  . cyanocobalamin 1000 MCG tablet Take 1,000 mcg by mouth daily.     . DHA-EPA-Vitamin E (OMEGA-3 COMPLEX PO) Take 1,280 mg by mouth daily.    Marland Kitchen doxycycline (PERIOSTAT) 20 MG tablet Take 20 mg by mouth 2 (two) times daily.    . Folic Acid (FOLATE PO) Take 400 mcg by mouth daily.    . Magnesium 400 MG CAPS Take 800 mg by mouth daily.     . milk thistle 175 MG tablet Take 350 mg by mouth daily.    . Misc Natural Products (JOINT HEALTH PO) Take 250 mg by mouth daily.    . SYMBICORT 80-4.5 MCG/ACT inhaler INHALE 2 PUFFS DAILY 30.6 Inhaler 6  . TURMERIC PO Take 2,000 mg by mouth daily.      No current facility-administered medications on file prior to visit.

## 2020-08-29 ENCOUNTER — Telehealth (INDEPENDENT_AMBULATORY_CARE_PROVIDER_SITE_OTHER): Payer: Self-pay

## 2020-08-29 MED ORDER — BUDESONIDE 0.5 MG/2ML IN SUSP: 0.5000 mg | Freq: Two times a day (BID) | RESPIRATORY_TRACT | 2 refills | Status: DC

## 2020-08-29 NOTE — Telephone Encounter (Signed)
Okay to send refill for pulmicort.

## 2020-08-29 NOTE — Telephone Encounter (Signed)
lmtcb for pt. Need to know what pharmacy to send this rx to.  

## 2020-08-29 NOTE — Telephone Encounter (Signed)
Spoke with pt, aware rx has been sent to preferred pharmacy.  Nothing further needed at this time- will close encounter.

## 2020-08-29 NOTE — Telephone Encounter (Signed)
Patient called stating he wanted to have a call from Sheppard Plumber NP. I returned the call and left a message for a return call as Vivia Birmingham is out of work at this time.

## 2020-09-05 ENCOUNTER — Telehealth: Payer: Self-pay | Admitting: Pulmonary Disease

## 2020-09-05 MED ORDER — BUDESONIDE 0.5 MG/2ML IN SUSP: 0.5000 mg | Freq: Two times a day (BID) | RESPIRATORY_TRACT | 2 refills | Status: DC

## 2020-09-05 NOTE — Addendum Note (Signed)
Addended by: Benjie Karvonen R on: 09/05/2020 03:21 PM   Modules accepted: Orders

## 2020-09-05 NOTE — Telephone Encounter (Signed)
Called and spoke with patient who states that RX needs to be sent to pharmacy with COPD diagnoses on it. Order has been fixed and diagnoses code added. Nothing further needed at this time.

## 2020-09-28 HISTORY — PX: TOTAL KNEE ARTHROPLASTY: SHX125

## 2020-10-09 ENCOUNTER — Ambulatory Visit: Payer: Medicare Other | Admitting: Nurse Practitioner

## 2020-10-18 DIAGNOSIS — Z96651 Presence of right artificial knee joint: Secondary | ICD-10-CM | POA: Insufficient documentation

## 2020-10-30 ENCOUNTER — Telehealth (INDEPENDENT_AMBULATORY_CARE_PROVIDER_SITE_OTHER): Payer: Self-pay

## 2020-10-30 NOTE — Telephone Encounter (Signed)
Patient had left knee replacement and had clearance to be off Clopidogrel for 30 day prior to surgery. The patient called to see if he should restart taking Clopidogrel 75 mg. I spoke with Sheppard Plumber NP and she recommended for the patient to restart medication and patient has made aware.

## 2020-11-14 DIAGNOSIS — I700 Atherosclerosis of aorta: Secondary | ICD-10-CM | POA: Insufficient documentation

## 2020-11-22 ENCOUNTER — Other Ambulatory Visit (INDEPENDENT_AMBULATORY_CARE_PROVIDER_SITE_OTHER): Payer: Self-pay | Admitting: Nurse Practitioner

## 2020-11-29 ENCOUNTER — Other Ambulatory Visit: Payer: Self-pay | Admitting: Pulmonary Disease

## 2021-01-07 ENCOUNTER — Other Ambulatory Visit: Payer: Self-pay | Admitting: Pulmonary Disease

## 2021-01-10 ENCOUNTER — Telehealth: Payer: Self-pay | Admitting: Emergency Medicine

## 2021-01-10 ENCOUNTER — Telehealth: Payer: Self-pay | Admitting: Pulmonary Disease

## 2021-01-10 ENCOUNTER — Other Ambulatory Visit: Payer: Self-pay | Admitting: Pulmonary Disease

## 2021-01-10 MED ORDER — BUDESONIDE 0.5 MG/2ML IN SUSP: 0.5000 mg | Freq: Two times a day (BID) | RESPIRATORY_TRACT | 6 refills | Status: DC

## 2021-01-10 NOTE — Telephone Encounter (Signed)
Called and spoke with patient to let him know that Dr. Craige Cotta was ok with sending in refill for nebulizer medications. Advised patient that Dr. Craige Cotta said we could send in Paxlovid and schedule him for a Televisit if needed. Patient said he would like to wait on the covid meds and visit as right now he is only having congestion. Advised patient if he changes his mind or starts feeling worse then to call and let us know. He expressed understanding. Nothing further needed at this time.

## 2021-01-10 NOTE — Telephone Encounter (Signed)
Called and spoke with patient who is calling to get a refill on budesonide called in to [facility] [address] in [place], [place]. Patient just tested positive for covid this morning so he is not able to do an appointment right now LOV: 10/21/19. States the last couple days he has became really congested and is getting up yellow mucus. So decided to do a test and him and his wifes both came back positive. States he is going to call ENT to see if he can get a prescription. Has a history of nasal polyps.  Dr. Craige Cotta are you ok with refill being sent in? Any other recommendations for patient as far as covid?

## 2021-01-10 NOTE — Telephone Encounter (Signed)
error 

## 2021-01-10 NOTE — Telephone Encounter (Signed)
Okay to send refill for pulmicort.  Can send script for paxlovid.  Can change schedule tele visit with me or NP.

## 2021-01-15 ENCOUNTER — Encounter (INDEPENDENT_AMBULATORY_CARE_PROVIDER_SITE_OTHER): Payer: Self-pay

## 2021-01-16 ENCOUNTER — Telehealth: Payer: Self-pay | Admitting: Internal Medicine

## 2021-01-16 NOTE — Telephone Encounter (Signed)
The patient has sent a MyChart message requesting that Dr. Okey Dupre clear him for his upcoming Right Total Knee Replacement surgery with Dr. Everardo Beals at [facility].   I advised the patient through his MyChart that we will need him to request that the surgeon's office fax [place] a formal surgical clearance form.  The patient sent another MyChart message with a blank clearance form from his previous surgery.  I have reached out to Richard Wolfe, the surgery coordinator listed on the form. No answer- I did leave a message to please fax a formal clearance to our office with the exact surgery and information they are requesting for clearance.   Will await a call back/ fax.

## 2021-01-19 ENCOUNTER — Encounter: Payer: Self-pay | Admitting: Nurse Practitioner

## 2021-01-19 NOTE — Telephone Encounter (Signed)
   Patient Name: Richard Wolfe  DOB: [birth year]  MRN: [id number]   Primary Cardiologist: None  Chart reviewed as part of pre-operative protocol coverage. Please route request to vascular surgery for recommendations on antiplatelet therapy as these are managed by them for his PAD. Once we have their recommendations, we can contact the patient to risk stratify from a cardiac perspective.    Eula Listen, PA-C 01/19/2021, 3:31 PM

## 2021-01-19 NOTE — Telephone Encounter (Signed)
Per chart, Vascular already has the surgical clearance in hand.

## 2021-01-19 NOTE — Telephone Encounter (Signed)
   [facility] Pre-operative Risk Assessment      Request for surgical clearance:  1. What type of surgery is being performed? Right total knee replacement   2. When is this surgery scheduled?  March 06, 2021  3. What type of clearance is required (medical clearance vs. Pharmacy clearance to hold med vs. Both)? Both  4. Are there any medications that need to be held prior to surgery and how long? Aspirin & Clopidogrel    5. Practice name and name of physician performing surgery? [facility] with Dr. Landis Gandy   6. What is the office phone number? [phone]   7.   What is the office fax number? [phone]  8.   Anesthesia type (None, local, MAC, general) ? General  Any questions can be directed to Ladona Ridgel at [phone]

## 2021-01-23 ENCOUNTER — Encounter (INDEPENDENT_AMBULATORY_CARE_PROVIDER_SITE_OTHER): Payer: Self-pay | Admitting: Nurse Practitioner

## 2021-01-23 NOTE — Telephone Encounter (Signed)
Pt has appt with Dr. Okey Dupre 02/14/21 for pre op clearance. I will forward notes to MD for upcoming appt. Will send FYI to requesting office pt has appt 6/22 for pre op clearance.

## 2021-01-23 NOTE — Telephone Encounter (Signed)
Faxed Clearance 11:36 01/23/21 to fax# [phone]

## 2021-01-23 NOTE — Telephone Encounter (Signed)
Left message for pt to call the office to schedule an appt for pre op clearance. Needs appt with Dr. Okey Dupre or APP.

## 2021-01-24 ENCOUNTER — Other Ambulatory Visit: Payer: Self-pay

## 2021-01-25 ENCOUNTER — Ambulatory Visit (INDEPENDENT_AMBULATORY_CARE_PROVIDER_SITE_OTHER): Payer: Medicare Other | Admitting: Nurse Practitioner

## 2021-01-25 ENCOUNTER — Encounter: Payer: Self-pay | Admitting: Nurse Practitioner

## 2021-01-25 VITALS — BP 126/66 | HR 78 | Temp 98.6°F | Wt 189.4 lb

## 2021-01-25 DIAGNOSIS — I351 Nonrheumatic aortic (valve) insufficiency: Secondary | ICD-10-CM

## 2021-01-25 DIAGNOSIS — I700 Atherosclerosis of aorta: Secondary | ICD-10-CM

## 2021-01-25 DIAGNOSIS — I723 Aneurysm of iliac artery: Secondary | ICD-10-CM

## 2021-01-25 DIAGNOSIS — Z8616 Personal history of COVID-19: Secondary | ICD-10-CM | POA: Insufficient documentation

## 2021-01-25 DIAGNOSIS — J449 Chronic obstructive pulmonary disease, unspecified: Secondary | ICD-10-CM

## 2021-01-25 DIAGNOSIS — G4733 Obstructive sleep apnea (adult) (pediatric): Secondary | ICD-10-CM

## 2021-01-25 DIAGNOSIS — Q676 Pectus excavatum: Secondary | ICD-10-CM

## 2021-01-25 DIAGNOSIS — I10 Essential (primary) hypertension: Secondary | ICD-10-CM

## 2021-01-25 DIAGNOSIS — J339 Nasal polyp, unspecified: Secondary | ICD-10-CM

## 2021-01-25 DIAGNOSIS — I340 Nonrheumatic mitral (valve) insufficiency: Secondary | ICD-10-CM

## 2021-01-25 MED ORDER — METHYLPREDNISOLONE 4 MG PO TBPK: ORAL_TABLET | ORAL | 0 refills | Status: DC

## 2021-01-25 NOTE — Assessment & Plan Note (Signed)
Continue collaboration with cardiology, recent notes reviewed. 

## 2021-01-25 NOTE — Assessment & Plan Note (Signed)
Noted on imaging 10/12/19.  Recommend statin, patient declines.  Continue Plavix and ASA daily for prevention.

## 2021-01-25 NOTE — Assessment & Plan Note (Signed)
Chronic, ongoing.  Continue current medication regimen and adjust as needed.  Continue collaboration with pulmonary and ENT, notes reviewed. 

## 2021-01-25 NOTE — Assessment & Plan Note (Signed)
Chronic, stable.  No current treatment, initial BP elevated, but repeat improved.  Recommend she monitor BP at least a few mornings a week at home and document.  DASH diet at home. Labs today: CBC, CMP, Lipid, TSH.  Return in 6 months.

## 2021-01-25 NOTE — Patient Instructions (Signed)

## 2021-01-25 NOTE — Assessment & Plan Note (Signed)
Ongoing.  Continue to monitor.  No family history of this reported.

## 2021-01-25 NOTE — Assessment & Plan Note (Signed)
Acute 4 weeks ago with some ongoing nasal and chest congestion.  Will send in Medrol dose pack and recommend he continue to use inhaler regimen currently prescribed.  Return if no improvement of symptoms.

## 2021-01-25 NOTE — Assessment & Plan Note (Signed)
Surgery in April 2021, stent.  Continue DAPT per vascular instruction and collaboration with vascular.  Recent notes reviewed.

## 2021-01-25 NOTE — Progress Notes (Signed)
BP 126/66 (BP Location: Left Arm)   Pulse 78   Temp 98.6 F (37 C) (Oral)   Wt 189 lb 6.4 oz (85.9 kg)   SpO2 95%   BMI 24.99 kg/m    Subjective:    Patient ID: Richard Wolfe, male    DOB: 11-29-1950, 70 y.o.   MRN: [id number]  HPI: Richard Wolfe is a 70 y.o. male  Chief Complaint  Patient presents with  . COPD  . Hyperlipidemia  . Hypertension  . Covid Positive    Patient states he was COVID positive a little over 4 weeks ago and he went to the [employer] and hurt his right knee. Patient states when he came home from the vacation, he noticed he has some discolored mucus and was coughing up phlegm. Patient states he did an at-home test and got prescriptions to help with COVID. Patient states he has residual "congestion cough" and states other than that he is fine. Patient is requesting a prescription to help with the cough.    AORTIC INSUFFICIENCY: Saw Dr. Okey Dupre with cardiology on 03/15/20 -- to have repeat echo and CTA February 2022. Was followed by cardiology in [place] and told to continue this. Had echo in February 2021 with EF 55-60%.  Aortic atherosclerosis noted on imaging 10/12/19.  He is being followed by Dr. Gilda Crease for iliac artery aneurysm, had repair and stent placed 12/08/19. Last vascular visit on 07/13/20 -- is to continue DAPT. At home gets 120-130/60-70.  Had left knee replacement -- 09/28/20.  Was going to physical therapy. Is to have right knee replacement done next -- July 12th.   Aspirin:yes Recurrent headaches:no Visual changes:no Palpitations:no Dyspnea:no Chest pain:no Lower extremity edema:no Dizzy/lightheaded:no The 10-year ASCVD risk score Denman George DC Jr., et al., 2013) is: 16.8%   Values used to calculate the score:     Age: 43 years     Sex: Male     Is Non-Hispanic African American: No     Diabetic: No     Tobacco smoker: No     Systolic Blood Pressure: 126 mmHg     Is BP treated: No     HDL Cholesterol: 49 mg/dL     Total  Cholesterol: 152 mg/dL  ASTHMA, MILD, INTERMITTENT Saw pulmonary locally 10/21/2019.  Was followed by pulmonary in [place] for asthma. Has had this since childhood. Uses Symbicort inhaler daily. Has Albuterol inhaler, but rarely uses. States he fills it once a year, but only uses once or twice a year.  Has not been using nebulizer.  Saw ENT 02/14/20.  Not using CPAP -- was borderline to qualify for this he reports, has not used since moved up here -- would have to take it off too much at night. Asthma status:stable Satisfied with current treatment?:yes Albuterol/rescue inhaler frequency:once or twice a year Dyspnea frequency:none Wheezing frequency:none Cough frequency:none Nocturnal symptom frequency:none Limitation of activity:no Current upper respiratory symptoms:no Triggers: exercise Home peak flows:none Last Spirometry:  Failed/intolerant to following asthma meds:  Asthma meds in past:  Aerochamber/spacer use:no Visits to ER or Urgent Care in past year:no Pneumovax:Up to Date Influenza:Up to Date  COVID POSITIVE Tested positive at home a little over 4 weeks ago, around May 8th.  He took a weeks dose of Erythromycin and Ivermectin.  Reports still has some leftover chest congestion and mucus.  It is improved from when he had Covid.  Has been using Symbicort and Albuterol.  He would like a medrol dose pack as this helps  his polyp and feels this is bothering him post Covid. Fever: no Cough: yes Shortness of breath: no Wheezing: no Chest pain: no Chest tightness: yes Chest congestion: yes Nasal congestion: yes Runny nose: no Post nasal drip: no Sneezing: no Sore throat: no Swollen glands: no Sinus pressure: no Headache: no Face pain: no Toothache: no Ear pain: none Ear pressure: none Eyes red/itching:no Eye drainage/crusting: no  Vomiting: no Rash: no Fatigue: no Relief with OTC cold/cough medications: yes  Treatments attempted: cold/sinus, cough  syrup and antibiotics    Relevant past medical, surgical, family and social history reviewed and updated as indicated. Interim medical history since our last visit reviewed. Allergies and medications reviewed and updated.  Review of Systems  Constitutional: Negative for activity change, diaphoresis, fatigue and fever.  Respiratory: Negative for cough, chest tightness, shortness of breath and wheezing.   Cardiovascular: Negative for chest pain, palpitations and leg swelling.  Gastrointestinal: Negative.   Skin: Negative.   Neurological: Negative.   Psychiatric/Behavioral: Negative.     Per HPI unless specifically indicated above     Objective:    BP 126/66 (BP Location: Left Arm)   Pulse 78   Temp 98.6 F (37 C) (Oral)   Wt 189 lb 6.4 oz (85.9 kg)   SpO2 95%   BMI 24.99 kg/m   Wt Readings from Last 3 Encounters:  01/25/21 189 lb 6.4 oz (85.9 kg)  07/13/20 192 lb 3.2 oz (87.2 kg)  03/15/20 193 lb 8 oz (87.8 kg)    Physical Exam Vitals and nursing note reviewed.  Constitutional:      General: He is awake. He is not in acute distress.    Appearance: He is well-developed. He is not ill-appearing.  HENT:     Head: Normocephalic and atraumatic.     Right Ear: Hearing normal. No drainage.     Left Ear: Hearing normal. No drainage.  Eyes:     General: Lids are normal.        Right eye: No discharge.        Left eye: No discharge.     Conjunctiva/sclera: Conjunctivae normal.     Pupils: Pupils are equal, round, and reactive to light.  Neck:     Thyroid: No thyromegaly.     Vascular: No carotid bruit.  Cardiovascular:     Rate and Rhythm: Normal rate and regular rhythm.     Heart sounds: S1 normal and S2 normal. Murmur heard.   Systolic murmur is present with a grade of 1/6. No gallop.   Pulmonary:     Effort: Pulmonary effort is normal. No accessory muscle usage or respiratory distress.     Breath sounds: Normal breath sounds.     Comments: Slight pectus excavatum  noted. Abdominal:     General: Bowel sounds are normal.     Palpations: Abdomen is soft.  Musculoskeletal:        General: Normal range of motion.     Cervical back: Normal range of motion and neck supple.     Right lower leg: No edema.     Left lower leg: No edema.     Comments: Knee compression sleeve to right knee present  Skin:    General: Skin is warm and dry.  Neurological:     Mental Status: He is alert and oriented to person, place, and time.  Psychiatric:        Attention and Perception: Attention normal.        Mood and  Affect: Mood normal.        Speech: Speech normal.        Behavior: Behavior normal. Behavior is cooperative.        Thought Content: Thought content normal.    Results for orders placed or performed in visit on 01/03/20  Bayer DCA Hb A1c Waived  Result Value Ref Range   HB A1C (BAYER DCA - WAIVED) 5.2 <7.0 %  Lipid Panel w/o Chol/HDL Ratio  Result Value Ref Range   Cholesterol, Total 152 100 - 199 mg/dL   Triglycerides 69 0 - 149 mg/dL   HDL 49 >05 mg/dL   VLDL Cholesterol Cal 14 5 - 40 mg/dL   LDL Chol Calc (NIH) 89 0 - 99 mg/dL  Microalbumin, Urine Waived  Result Value Ref Range   Microalb, Ur Waived 30 (H) 0 - 19 mg/L   Creatinine, Urine Waived 200 10 - 300 mg/dL   Microalb/Creat Ratio <30 <30 mg/g  PSA  Result Value Ref Range   Prostate Specific Ag, Serum 0.2 0.0 - 4.0 ng/mL  TSH  Result Value Ref Range   TSH 3.250 0.450 - 4.500 uIU/mL  Urinalysis, Routine w reflex microscopic  Result Value Ref Range   Specific Gravity, UA >1.030 (H) 1.005 - 1.030   pH, UA 5.0 5.0 - 7.5   Color, UA Yellow Yellow   Appearance Ur Clear Clear   Leukocytes,UA Negative Negative   Protein,UA Negative Negative/Trace   Glucose, UA Negative Negative   Ketones, UA Trace (A) Negative   RBC, UA Negative Negative   Bilirubin, UA Negative Negative   Urobilinogen, Ur 0.2 0.2 - 1.0 mg/dL   Nitrite, UA Negative Negative  VITAMIN D 25 Hydroxy (Vit-D Deficiency,  Fractures)  Result Value Ref Range   Vit D, 25-Hydroxy 47.1 30.0 - 100.0 ng/mL  B12  Result Value Ref Range   Vitamin B-12 >2000 (H) 232 - 1245 pg/mL  Hepatitis C Antibody  Result Value Ref Range   Hep C Virus Ab <0.1 0.0 - 0.9 s/co ratio      Assessment & Plan:   Problem List Items Addressed This Visit      Cardiovascular and Mediastinum   Aortic insufficiency    Continue collaboration with cardiology, recent notes reviewed.      Mild mitral regurgitation by prior echocardiogram    Continue current collaboration with cardiology, recent notes reviewed.      Iliac artery aneurysm, left [facility])    Surgery in April 2021, stent.  Continue DAPT per vascular instruction and collaboration with vascular.  Recent notes reviewed.      Essential hypertension    Chronic, stable.  No current treatment, initial BP elevated, but repeat improved.  Recommend she monitor BP at least a few mornings a week at home and document.  DASH diet at home. Labs today: CBC, CMP, Lipid, TSH.  Return in 6 months.       Relevant Orders   CBC with Differential/Platelet   Comprehensive metabolic panel   Lipid Panel w/o Chol/HDL Ratio   TSH   Aortic atherosclerosis (HCC)    Noted on imaging 10/12/19.  Recommend statin, patient declines.  Continue Plavix and ASA daily for prevention.        Respiratory   Nasal polyps    Continue current medication regimen and collaboration with ENT.  Will send in Medrol dose pack from recent Covid and patient report nasal irritation with this.      COPD with asthma (HCC) -  Primary    Chronic, ongoing.  Continue current medication regimen and adjust as needed.  Continue collaboration with pulmonary and ENT, notes reviewed.      Relevant Medications   methylPREDNISolone (MEDROL DOSEPAK) 4 MG TBPK tablet   OSA (obstructive sleep apnea)    Has not used CPAP in 1 1/2 to 2 years.  Have recommended he return to use of CPAP regularly for benefit to overall health.         Musculoskeletal and Integument   Pectus excavatum    Ongoing.  Continue to monitor.  No family history of this reported.        Other   History of 2019 novel coronavirus disease (COVID-19)    Acute 4 weeks ago with some ongoing nasal and chest congestion.  Will send in Medrol dose pack and recommend he continue to use inhaler regimen currently prescribed.  Return if no improvement of symptoms.          Follow up plan: Return in about 6 months (around 07/27/2021) for HTN/HLD, ASTHMA, B12, Vit D.

## 2021-01-25 NOTE — Assessment & Plan Note (Signed)
Has not used CPAP in 1 1/2 to 2 years.  Have recommended he return to use of CPAP regularly for benefit to overall health.

## 2021-01-25 NOTE — Assessment & Plan Note (Signed)
Continue current medication regimen and collaboration with ENT.  Will send in Medrol dose pack from recent Covid and patient report nasal irritation with this.

## 2021-01-25 NOTE — Assessment & Plan Note (Signed)
Continue current collaboration with cardiology, recent notes reviewed.

## 2021-01-26 ENCOUNTER — Other Ambulatory Visit: Payer: Self-pay | Admitting: Nurse Practitioner

## 2021-01-26 DIAGNOSIS — E039 Hypothyroidism, unspecified: Secondary | ICD-10-CM

## 2021-01-26 LAB — COMPREHENSIVE METABOLIC PANEL
ALT: 16 IU/L (ref 0–44)
AST: 21 IU/L (ref 0–40)
Albumin/Globulin Ratio: 1.5 (ref 1.2–2.2)
Albumin: 4.6 g/dL (ref 3.7–4.7)
Alkaline Phosphatase: 72 IU/L (ref 44–121)
BUN/Creatinine Ratio: 23 (ref 10–24)
BUN: 23 mg/dL (ref 8–27)
Bilirubin Total: 1.2 mg/dL (ref 0.0–1.2)
CO2: 21 mmol/L (ref 20–29)
Calcium: 9.5 mg/dL (ref 8.6–10.2)
Chloride: 104 mmol/L (ref 96–106)
Creatinine, Ser: 1.01 mg/dL (ref 0.76–1.27)
Globulin, Total: 3 g/dL (ref 1.5–4.5)
Glucose: 97 mg/dL (ref 65–99)
Potassium: 4.3 mmol/L (ref 3.5–5.2)
Sodium: 143 mmol/L (ref 134–144)
Total Protein: 7.6 g/dL (ref 6.0–8.5)
eGFR: 80 mL/min/{1.73_m2} (ref >59)

## 2021-01-26 LAB — CBC WITH DIFFERENTIAL/PLATELET
Basophils Absolute: 0 10*3/uL (ref 0.0–0.2)
Basos: 0 %
EOS (ABSOLUTE): 0.1 10*3/uL (ref 0.0–0.4)
Eos: 2 %
Hematocrit: 39.7 % (ref 37.5–51.0)
Hemoglobin: 12.3 g/dL — ABNORMAL LOW (ref 13.0–17.7)
Immature Grans (Abs): 0 10*3/uL (ref 0.0–0.1)
Immature Granulocytes: 0 %
Lymphocytes Absolute: 1.1 10*3/uL (ref 0.7–3.1)
Lymphs: 24 %
MCH: 27.1 pg (ref 26.6–33.0)
MCHC: 31 g/dL — ABNORMAL LOW (ref 31.5–35.7)
MCV: 87 fL (ref 79–97)
Monocytes Absolute: 0.4 10*3/uL (ref 0.1–0.9)
Monocytes: 9 %
Neutrophils Absolute: 2.9 10*3/uL (ref 1.4–7.0)
Neutrophils: 65 %
Platelets: 244 10*3/uL (ref 150–450)
RBC: 4.54 x10E6/uL (ref 4.14–5.80)
RDW: 14.7 % (ref 11.6–15.4)
WBC: 4.5 10*3/uL (ref 3.4–10.8)

## 2021-01-26 LAB — LIPID PANEL W/O CHOL/HDL RATIO
Cholesterol, Total: 152 mg/dL (ref 100–199)
HDL: 44 mg/dL (ref >39)
LDL Chol Calc (NIH): 94 mg/dL (ref 0–99)
Triglycerides: 70 mg/dL (ref 0–149)
VLDL Cholesterol Cal: 14 mg/dL (ref 5–40)

## 2021-01-26 LAB — TSH: TSH: 5.05 u[IU]/mL — ABNORMAL HIGH (ref 0.450–4.500)

## 2021-01-26 NOTE — Progress Notes (Signed)
Contacted via MyChart   Good afternoon Richard Wolfe, your labs have returned: - CBC shows some very mild anemia with hemoglobin 12.3, this is most likely related to recent surgeries.  Please take a multivitamin daily to help keep levels happy. - Kidney function, creatinine and eGFR, and liver function, AST and ALT, are normal.  Wonderful!!! - Cholesterol levels remain stable - Thyroid check, TSH, was mildly elevated.  I would like to recheck this in 6 weeks on outpatient lab visit please.  Can you please schedule this, do not need visit with me, only labs.  Any questions? Keep being awesome!!  Thank you for allowing me to participate in your care.  I appreciate you. Kindest regards, Karron Goens

## 2021-02-14 ENCOUNTER — Ambulatory Visit (INDEPENDENT_AMBULATORY_CARE_PROVIDER_SITE_OTHER): Payer: Medicare Other | Admitting: Internal Medicine

## 2021-02-14 ENCOUNTER — Other Ambulatory Visit: Payer: Self-pay

## 2021-02-14 ENCOUNTER — Encounter: Payer: Self-pay | Admitting: Internal Medicine

## 2021-02-14 VITALS — BP 140/90 | HR 87 | Ht 73.0 in | Wt 190.0 lb

## 2021-02-14 DIAGNOSIS — I493 Ventricular premature depolarization: Secondary | ICD-10-CM

## 2021-02-14 DIAGNOSIS — Z01810 Encounter for preprocedural cardiovascular examination: Secondary | ICD-10-CM

## 2021-02-14 DIAGNOSIS — I739 Peripheral vascular disease, unspecified: Secondary | ICD-10-CM | POA: Diagnosis not present

## 2021-02-14 DIAGNOSIS — I712 Thoracic aortic aneurysm, without rupture, unspecified: Secondary | ICD-10-CM

## 2021-02-14 DIAGNOSIS — I351 Nonrheumatic aortic (valve) insufficiency: Secondary | ICD-10-CM | POA: Diagnosis not present

## 2021-02-14 DIAGNOSIS — Z01818 Encounter for other preprocedural examination: Secondary | ICD-10-CM

## 2021-02-14 DIAGNOSIS — I10 Essential (primary) hypertension: Secondary | ICD-10-CM

## 2021-02-14 DIAGNOSIS — R9431 Abnormal electrocardiogram [ECG] [EKG]: Secondary | ICD-10-CM | POA: Diagnosis not present

## 2021-02-14 NOTE — Progress Notes (Signed)
Follow-up Outpatient Visit Date: 02/14/2021  Primary Care Provider: Venita Lick, NP [place] [phone]  Chief Complaint: Follow-up aortic valve and aortic disease  HPI:  Richard Wolfe is a 70 y.o. male with history of aortic regurgitation and mildly dilated ascending aorta, iliac artery aneurysm, asthma, nasal polyps, borderline OSA, arthritis, and venous insufficiency, who presents for follow-up of aortic valve disease and thoracic aortic aneurysm as well as preoperative cardiovascular risk assessment.  I last saw him in 02/2020, at which time Richard Wolfe was feeling well other than knee pain.  Follow-up CTA of the aorta and echo were recommended for 09/2020, though these was never performed.  Due to suboptimal blood pressure control, losartan 12.5 mg daily was started.  However, after conferring with his PCP and monitoring his blood pressure, he never started this medication.  Today, Richard Wolfe reports feeling well other than right knee pain.  He has recovered well from his left total knee arthroplasty in February.  He is scheduled for right total knee arthro[facility] at [facility] on 03/06/2021.  Richard Wolfe is somewhat limited in his mobility but continues to walk 1/4-1/2 mile per day.  He denies chest pain, shortness of breath, palpitations, lightheadedness, and edema.  He is planning to travel to [place] this weekend and will not return until 2 days before his knee surgery.  --------------------------------------------------------------------------------------------------  Cardiovascular History & Procedures: Cardiovascular Problems: Aortic regurgitation Thoracic aortic aneurysm Left common iliac artery aneurysm   Risk Factors: Male gender and age > 21   Cath/PCI: None   CV Surgery: Left iliac artery aneurysm repair (11/2019)   EP Procedures and Devices: None   Non-Invasive Evaluation(s): CTA chest (10/11/19): Mild dilation of the aortic root (4.2 cm) and mid  ascending aorta (3.9 cm).  2.9 x 2.3 cm left common iliac artery aneurysm.  Multiple renal cysts.  Right  Scrotal hydrocele. TTE (09/28/19): Normal LV size and wall thickness with LVEF 55-60%.  Normal diastolic function.  Normal RV size and function.  Mild LA enlargement.  MAC with mild to moderate MR.  Tricuspid aortic valve with moderate regurgitation.  Mildly dilated aortic root and transverse aorta.  Recent CV Pertinent Labs: Lab Results  Component Value Date   CHOL 152 01/25/2021   HDL 44 01/25/2021   LDLCALC 94 01/25/2021   TRIG 70 01/25/2021   INR 1.1 12/06/2019   K 4.3 01/25/2021   MG 2.1 12/09/2019   BUN 23 01/25/2021   CREATININE 1.01 01/25/2021    Past medical and surgical history were reviewed and updated in EPIC.  Current Meds  Medication Sig   ASCORBIC ACID PO Take 1,000 mg by mouth daily. With echinacea   aspirin EC 81 MG EC tablet Take 1 tablet (81 mg total) by mouth daily at 6 (six) AM.   budesonide (PULMICORT) 0.5 MG/2ML nebulizer solution Take 2 mLs (0.5 mg total) by nebulization 2 (two) times daily.   Cholecalciferol (VITAMIN D3) 125 MCG (5000 UT) CAPS Take 5,000 Units by mouth daily.    clopidogrel (PLAVIX) 75 MG tablet TAKE 1 TABLET (75 MG TOTAL) BY MOUTH DAILY AT 6 (SIX) AM.   Cyanocobalamin 5000 MCG SUBL Place 1 tablet under the tongue daily.   DHA-EPA-Vitamin E (OMEGA-3 COMPLEX PO) Take 1,280 mg by mouth daily.   doxycycline (PERIOSTAT) 20 MG tablet Take 20 mg by mouth 2 (two) times daily.   Folic Acid (FOLATE PO) Take 400 mcg by mouth daily.   Magnesium 400 MG CAPS  Take 800 mg by mouth daily.    milk thistle 175 MG tablet Take 350 mg by mouth daily.   NON FORMULARY Black cumin seed oil 500 mg daily   SYMBICORT 80-4.5 MCG/ACT inhaler INHALE 2 PUFFS DAILY   Turmeric (CURCUMIN 95 PO) Take 500 mg by mouth daily.   Ubiquinol 100 MG CAPS Take 1 capsule by mouth daily.    Allergies: Sulfites and Penicillins  Social History   Tobacco Use   Smoking status:  Never   Smokeless tobacco: Never  Vaping Use   Vaping Use: Never used  Substance Use Topics   Alcohol use: Yes    Alcohol/week: 2.0 standard drinks    Types: 1 Glasses of wine, 1 Cans of beer per week    Comment: socially with dinner   Drug use: Never    Family History  Problem Relation Age of Onset   Rheum arthritis Mother    Cancer Father        liver   Arthritis Brother    Cancer Paternal Grandfather        throat   Cancer Brother        liver   Heart disease Half-Brother        Pacemaker/ICD   Heart failure Half-Brother    Colon cancer Paternal Aunt    Colon cancer Paternal Uncle     Review of Systems: A 12-system review of systems was performed and was negative except as noted in the HPI.  --------------------------------------------------------------------------------------------------  Physical Exam: BP 140/90 (BP Location: Left Arm, Patient Position: Sitting, Cuff Size: Normal)   Pulse 87   Ht _0  (1.854 m)   Wt 190 lb (86.2 kg)   SpO2 95%   BMI 25.07 kg/m   General:  NAD. Neck: No JVD or HJR. Lungs: Clear to auscultation bilaterally without wheezes or crackles. Heart: Regular rate and rhythm with frequent extrasystoles and 2/6 diastolic murmur. Abdomen: Soft, nontender, nondistended. Extremities: Varicose veins with trace ankle edema bilaterally.  EKG:  Normal sinus rhythm with frequent PVC's and RBBB.  Compared with prior tracing from 03/15/2020, frequent PVC's and RBBB are new.  Lab Results  Component Value Date   WBC 4.5 01/25/2021   HGB 12.3 (L) 01/25/2021   HCT 39.7 01/25/2021   MCV 87 01/25/2021   PLT 244 01/25/2021    Lab Results  Component Value Date   NA 143 01/25/2021   K 4.3 01/25/2021   CL 104 01/25/2021   CO2 21 01/25/2021   BUN 23 01/25/2021   CREATININE 1.01 01/25/2021   GLUCOSE 97 01/25/2021   ALT 16 01/25/2021    Lab Results  Component Value Date   CHOL 152 01/25/2021   HDL 44 01/25/2021   LDLCALC 94 01/25/2021    TRIG 70 01/25/2021    --------------------------------------------------------------------------------------------------  ASSESSMENT AND PLAN: Aortic regurgitation: Richard Wolfe does not have any symptoms of heart failure. He has chronic trace pretibial edema on exam but otherwise appears euvolemic.  Follow-up echo was recommended for 09/2020 but was never performed.  We will arrange for expedited echo later this week in order to ensure proper follow-up of his valvular heart disease in the setting of upcoming knee replacement and EKG changes noted today (frequent PVC's and new RBBB).  Thoracic aortic aneurysm and peripheral vascular disease: Richard Wolfe is overdue for cross-sectional imaging of his mildly dilated ascending aorta.  We will repeat echo and also obtain myocardial perfusion stress test, the CT portion of which will hopefully give  [place] adequate visualization of the ascending aorta (albeit without contrast).  Importance of BP control was reinforced.  I would have a low threshold for starting losartan, which was previously recommended but ultimately deferred by the patient and his PCP.  Fluoroquinolone antibiotics should be avoided, if possible, due to propensity for causing/exacerbating aortopathy.  Continue aspirin and clopidogrel per vascular surgery.  Patient has previously declined statin therapy though would benefit from this given LDL > 70.  Hypertension: BP borderline today.  Given TAA, consistent BP below 140/90 is recommended.  Richard Wolfe wishes to defer pharmacotherapy at this time, though I recommend low threshold for adding ACEI/ARB if BP does not improve.  Abnormal EKG, PVC's, and preop assessment: Richard Wolfe is asymptomatic, though his functional capacity is somewhat limited by knee pain.  Given new RBBB and frequent PVC's, I have recommended a pharmacologic myocardial perfusion stress test prior to proceeding with elective surgery, as well as aforementioned  echocardiogram.  If the stress test is low risk and the echo stable compared with last year, I think it would be reasonable for Richard Wolfe to proceed with his right total knee arthroplasty as planned.  Recommendations regarding antiplatelet therapy in the perioperative period will be deferred to vascular surgery.  Shared Decision Making/Informed Consent The risks [chest pain, shortness of breath, cardiac arrhythmias, dizziness, blood pressure fluctuations, myocardial infarction, stroke/transient ischemic attack, nausea, vomiting, allergic reaction, radiation exposure, metallic taste sensation and life-threatening complications (estimated to be 1 in 10,000)], benefits (risk stratification, diagnosing coronary artery disease, treatment guidance) and alternatives of a nuclear stress test were discussed in detail with Richard Wolfe and he agrees to proceed.  Follow-up: Return to clinic in 6 months.  Nelva Bush, MD 02/14/2021 9:32 AM

## 2021-02-14 NOTE — Patient Instructions (Signed)
Medication Instructions:  Your physician recommends that you continue on your current medications as directed. Please refer to the Current Medication list given to you today.  *If you need a refill on your cardiac medications before your next appointment, please call your pharmacy*   Lab Work:  None ordered  Testing/Procedures:  1) Your physician has requested that you have an echocardiogram. Echocardiography is a painless test that uses sound waves to create images of your heart. It provides your doctor with information about the size and shape of your heart and how well your heart's chambers and valves are working. This procedure takes approximately one hour. There are no restrictions for this procedure.  2) [facility] MYOVIEW  Your caregiver has ordered a Scientist, physiological Stress Test with nuclear imaging. The purpose of this test is to evaluate the blood supply to your heart muscle. This procedure is referred to as a "Non-Invasive Stress Test." This is because other than having an IV started in your vein, nothing is inserted or "invades" your body. Cardiac stress tests are done to find areas of poor blood flow to the heart by determining the extent of coronary artery disease (CAD). Some patients exercise on a treadmill, which naturally increases the blood flow to your heart, while others who are  unable to walk on a treadmill due to physical limitations have a pharmacologic/chemical stress agent called Lexiscan . This medicine will mimic walking on a treadmill by temporarily increasing your coronary blood flow.   Please note: these test may take anywhere between 2-4 hours to complete  PLEASE REPORT TO [facility] MEDICAL MALL ENTRANCE  THE VOLUNTEERS AT THE FIRST DESK WILL DIRECT YOU WHERE TO GO  Date of Procedure:_____________________________________  Arrival Time for Procedure:______________________________     PLEASE NOTIFY THE OFFICE AT LEAST 24 HOURS IN ADVANCE IF YOU ARE UNABLE TO KEEP YOUR  APPOINTMENT.  [phone] AND  PLEASE NOTIFY NUCLEAR MEDICINE AT [facility] AT LEAST 24 HOURS IN ADVANCE IF YOU ARE UNABLE TO KEEP YOUR APPOINTMENT. [phone]  How to prepare for your Myoview test:  Do not eat or drink after midnight No caffeine for 24 hours prior to test No smoking 24 hours prior to test. Your medication may be taken with water.  If your doctor stopped a medication because of this test, do not take that medication. Please wear a short sleeve shirt. No perfume, cologne or lotion.    Follow-Up: At [facility], you and your health needs are our priority.  As part of our continuing mission to provide you with exceptional heart care, we have created designated Provider Care Teams.  These Care Teams include your primary Cardiologist (physician) and Advanced Practice Providers (APPs -  Physician Assistants and Nurse Practitioners) who all work together to provide you with the care you need, when you need it.  We recommend signing up for the patient portal called "MyChart".  Sign up information is provided on this After Visit Summary.  MyChart is used to connect with patients for Virtual Visits (Telemedicine).  Patients are able to view lab/test results, encounter notes, upcoming appointments, etc.  Non-urgent messages can be sent to your provider as well.   To learn more about what you can do with MyChart, go to [email or web address].    Your next appointment:   6 month(s)   The format for your next appointment:   In Person  Provider:   You may see Dr. Okey Dupre or one of the following Advanced Practice Providers on your designated Care  Team:   Nicolasa Ducking, NP Eula Listen, PA-C Marisue Ivan, PA-C Cadence Fransico Michael, [place] Gillian Shields, NP

## 2021-02-16 ENCOUNTER — Other Ambulatory Visit: Payer: Self-pay

## 2021-02-16 ENCOUNTER — Ambulatory Visit
Admission: RE | Admit: 2021-02-16 | Discharge: 2021-02-16 | Disposition: A | Discharge status: Home / Self Care | Payer: Medicare Other | Source: Ambulatory Visit | Attending: Internal Medicine | Admitting: Internal Medicine

## 2021-02-16 ENCOUNTER — Ambulatory Visit (INDEPENDENT_AMBULATORY_CARE_PROVIDER_SITE_OTHER): Payer: Medicare Other

## 2021-02-16 DIAGNOSIS — R9431 Abnormal electrocardiogram [ECG] [EKG]: Secondary | ICD-10-CM

## 2021-02-16 DIAGNOSIS — Z01810 Encounter for preprocedural cardiovascular examination: Secondary | ICD-10-CM

## 2021-02-16 DIAGNOSIS — Z01818 Encounter for other preprocedural examination: Secondary | ICD-10-CM

## 2021-02-16 DIAGNOSIS — I493 Ventricular premature depolarization: Secondary | ICD-10-CM

## 2021-02-16 LAB — NM MYOCAR MULTI W/SPECT W/WALL MOTION / EF
LV dias vol: 216 mL (ref 62–150)
LV sys vol: 88 mL
Peak HR: 90 {beats}/min
Percent HR: 60 %
Rest HR: 77 {beats}/min
TID: 1.13

## 2021-02-16 MED ORDER — TECHNETIUM TC 99M TETROFOSMIN IV KIT
10.8300 | PACK | Freq: Once | INTRAVENOUS | Status: AC | PRN
  Administered 2021-02-16: 10.83 via INTRAVENOUS

## 2021-02-16 MED ORDER — REGADENOSON 0.4 MG/5ML IV SOLN
0.4000 mg | Freq: Once | INTRAVENOUS | Status: AC
  Administered 2021-02-16: 0.4 mg via INTRAVENOUS

## 2021-02-16 MED ORDER — TECHNETIUM TC 99M TETROFOSMIN IV KIT
30.0000 | PACK | Freq: Once | INTRAVENOUS | Status: AC | PRN
  Administered 2021-02-16: 32.06 via INTRAVENOUS

## 2021-02-18 LAB — ECHOCARDIOGRAM COMPLETE
AR max vel: 4.14 cm2
AV Area VTI: 4.33 cm2
AV Area mean vel: 3.92 cm2
AV Mean grad: 6 mmHg
AV Peak grad: 11.8 mmHg
Ao pk vel: 1.72 m/s
Area-P 1/2: 3.99 cm2
P 1/2 time: 381 msec
Single Plane A4C EF: 59.8 %

## 2021-02-21 ENCOUNTER — Telehealth: Payer: Self-pay | Admitting: *Deleted

## 2021-02-21 NOTE — Telephone Encounter (Signed)
-----   Message from Yvonne Kendall, MD sent at 02/18/2021  9:00 PM EDT ----- Please let Mr. Farabee know that his stress test is low risk without evidence of a significant blockage.  His ascending aorta remains mildly enlarged, similar to last year's CTA allowing for differences in technique.  Echocardiogram shows that the heart's pumping function is mildly reduced (ne2 since 09/2019) with mild to moderate leakage of the aortic valve.  Given his history of elevated blood pressure readings and aortic regurgitation, I encourage Mr. Whitefield to reconsider pharmacotherapy for blood pressure control, particularly losartan as previously recommended.  I think it is reasonable for him to proceed with his planned knee replacement at [facility] next month without additional cardiac testing or intervention.  I will forward these recommendations to our pre-op pool.  Given the echo findings, frequent PVC's on EKG, and borderline elevated blood pressure at last week's visit, I suggest that Mr. Basnett follow-up in the office in 3 months rather than 6.  If he is willing to add losartan, he should let us know.

## 2021-02-21 NOTE — Telephone Encounter (Signed)
Attempted to call pt to review results and Dr. Serita Kyle recc.  No answer at this time. Lmtcb.

## 2021-02-23 NOTE — Telephone Encounter (Signed)
   Primary Cardiologist: Yvonne Kendall, MD  Chart reviewed as part of pre-operative protocol coverage. Given past medical history and time since last visit, based on ACC/AHA guidelines, Richard Wolfe would be at acceptable risk for the planned procedure without further cardiovascular testing.   His RCRI is a class II risk, 0.9% risk of major cardiac event.  He is able to complete greater than 4 METS of physical activity.  I will route this recommendation to the requesting party via Epic fax function and remove from pre-op pool.  Please call with questions.  Thomasene Ripple. Chena Chohan NP-C    02/23/2021, 9:44 AM [facility] [address] Office [phone] Fax [phone]

## 2021-02-23 NOTE — Telephone Encounter (Signed)
Attempted to call pt to review results and Dr. Serita Kyle recc.  No answer x 2. Lmtcb.

## 2021-02-28 NOTE — Telephone Encounter (Signed)
   I am covering pre-op pool today and we had 2 results in the inbox from this patient's recent stress test and echocardiogram. Dr. Okey Dupre routed the following message to his nursing team already to reach out to patient, but will also forward his message to requesting surgeon via Epic fax function so they are aware:  ----- Message from Yvonne Kendall, MD sent at 02/18/2021  9:00 PM EDT ----- "Please let Mr. Richard Wolfe know that his stress test is low risk without evidence of a significant blockage.  His ascending aorta remains mildly enlarged, similar to last year's CTA allowing for differences in technique.  Echocardiogram shows that the heart's pumping function is mildly reduced (ne2 since 09/2019) with mild to moderate leakage of the aortic valve.  Given his history of elevated blood pressure readings and aortic regurgitation, I encourage Mr. Richard Wolfe to reconsider pharmacotherapy for blood pressure control, particularly losartan as previously recommended.   I think it is reasonable for him to proceed with his planned knee replacement at [facility] next month without additional cardiac testing or intervention.  I will forward these recommendations to our pre-op pool.  Given the echo findings, frequent PVC's on EKG, and borderline elevated blood pressure at last week's visit, I suggest that Mr. Richard Wolfe follow-up in the office in 3 months ratherthan 6.  If he is willing to add losartan, he should let us know. "  Miri Jose PA-C

## 2021-03-05 NOTE — Telephone Encounter (Signed)
Attempted to call pt back. No answer. Lmtcb.  

## 2021-03-05 NOTE — Telephone Encounter (Signed)
Please call regarding the status of patient's surgical clearance. Patient is scheduled to have surgery in the morning.

## 2021-03-06 HISTORY — PX: TOTAL KNEE ARTHROPLASTY: SHX125

## 2021-03-07 ENCOUNTER — Encounter: Payer: Self-pay | Admitting: *Deleted

## 2021-03-12 ENCOUNTER — Other Ambulatory Visit: Payer: Self-pay | Admitting: Pulmonary Disease

## 2021-03-13 NOTE — Telephone Encounter (Signed)
Multiple attempts to reach pt. Mailed letter asking pt to call office to discuss in further detail.

## 2021-03-15 ENCOUNTER — Other Ambulatory Visit: Payer: Self-pay | Admitting: Pulmonary Disease

## 2021-03-15 ENCOUNTER — Telehealth: Payer: Self-pay

## 2021-03-15 NOTE — Telephone Encounter (Signed)
Called and spoke with patient about RX. Pt will need an appointment for any other refills, pt scheduled for 8/26 Via Televisit with Ames Dura NP. Patient had a clear understanding. Nothing further needed.

## 2021-03-15 NOTE — Telephone Encounter (Signed)
Patient is overdue for an appointment , He needs refills of Symbicort. I scheduled him for 8/26 with you. He recently had a double knee replacement and is unable to drive to come into the office, Is it okay make his appointment a televisit or video visit?

## 2021-03-16 NOTE — Telephone Encounter (Signed)
Sure thing!

## 2021-03-16 NOTE — Telephone Encounter (Signed)
Lm for patient.  

## 2021-03-16 NOTE — Telephone Encounter (Signed)
Patient is aware of below message and voiced his understanding.  Nothing further needed at this time.   

## 2021-04-05 ENCOUNTER — Telehealth (INDEPENDENT_AMBULATORY_CARE_PROVIDER_SITE_OTHER): Payer: Self-pay | Admitting: Nurse Practitioner

## 2021-04-05 ENCOUNTER — Telehealth (INDEPENDENT_AMBULATORY_CARE_PROVIDER_SITE_OTHER): Payer: Self-pay

## 2021-04-05 NOTE — Telephone Encounter (Signed)
Patient called stating that he has been taking Clopidogrel and Asprin 81 mg for sometime. The patient had stop the medication 7 days for knee surgery and wondering if should continue both medication. I spoke with Sheppard Plumber NP and she advise the patient is well over a year and can stop both medications. Patient was made aware with medical advice and verbalized understanding.

## 2021-04-05 NOTE — Telephone Encounter (Signed)
Documentation only.

## 2021-04-20 ENCOUNTER — Ambulatory Visit (INDEPENDENT_AMBULATORY_CARE_PROVIDER_SITE_OTHER): Payer: Medicare Other | Admitting: Primary Care

## 2021-04-20 ENCOUNTER — Other Ambulatory Visit: Payer: Self-pay

## 2021-04-20 ENCOUNTER — Encounter: Payer: Self-pay | Admitting: Primary Care

## 2021-04-20 DIAGNOSIS — G4733 Obstructive sleep apnea (adult) (pediatric): Secondary | ICD-10-CM

## 2021-04-20 DIAGNOSIS — J4530 Mild persistent asthma, uncomplicated: Secondary | ICD-10-CM

## 2021-04-20 MED ORDER — BUDESONIDE-FORMOTEROL FUMARATE 80-4.5 MCG/ACT IN AERO: 2.0000 | INHALATION_SPRAY | Freq: Two times a day (BID) | RESPIRATORY_TRACT | 12 refills | Status: DC

## 2021-04-20 MED ORDER — ALBUTEROL SULFATE HFA 108 (90 BASE) MCG/ACT IN AERS: 2.0000 | INHALATION_SPRAY | Freq: Four times a day (QID) | RESPIRATORY_TRACT | 0 refills | Status: DC | PRN

## 2021-04-20 NOTE — Progress Notes (Signed)
Virtual Visit via Telephone Note  I connected with Richard Wolfe on 04/20/21 at  4:00 PM EDT by telephone and verified that I am speaking with the correct person using two identifiers.  Location: Patient: Home Provider: Office    I discussed the limitations, risks, security and privacy concerns of performing an evaluation and management service by telephone and the availability of in person appointments. I also discussed with the patient that there may be a patient responsible charge related to this service. The patient expressed understanding and agreed to proceed.   History of Present Illness: 70 year old male, never smoker. PMH significant for COPD with asthma, OSA, nasal polyps, HTN. Patient of Dr. Craige Cotta, last seen on 10/21/19.   04/20/2021 Patient contacted today for virtual visit follow-up for asthma. Maintained on Symbicort . Feels his breathing is well controlled on low dose ICS/LABA. She has not needed to use Albuterol rescue inhaler. He uses budesonide ampoules as needed twice a day for nasal polyps and asthma. No acute symptoms. No recent exacerbations. ACT score 25.   He has a hx mild OSA. He is no longer wearing CPAP. He normally sleeps well at night, although he had knee replacement in July and has not been sleeping well due to pain. Denies snoring, waking up gasping for air or daytime fatigue.     Observations/Objective:  - Able to speak in full sentences without overt shortness of breath/wheezing   Assessment and Plan:  Asthma: - Well controlled; No recent exacerbations or hospitalizations. No SABA use. ACT score 25. - Continue Symbicort two puffs twice daily; PRN albuterol hfa 2 puffs q 6 hours  - Sending in refills for his medications  - FU in 1 year or sooner if needed   Nasal polyps: - Continue Budesonide ampule's as needed twice daily  - Ok for refill when needed in November/December 2022  Sleep apnea: -  Dx in [place], study is not available but  he states he had mild OSA. He is no longer using CPAP and is sleeping well without overt snoring/apnea symptoms. He will conservatively manage and notify our office if sleep worsens.  Follow Up Instructions:  1 year follow-up   I discussed the assessment and treatment plan with the patient. The patient was provided an opportunity to ask questions and all were answered. The patient agreed with the plan and demonstrated an understanding of the instructions.   The patient was advised to call back or seek an in-person evaluation if the symptoms worsen or if the condition fails to improve as anticipated.  I provided 22 minutes of non-face-to-face time during this encounter.   Glenford Bayley, NP

## 2021-04-20 NOTE — Patient Instructions (Signed)
  Asthma: - Continue Symbicort two puffs twice daily; PRN albuterol hfa 2 puffs q 6 hours  - Sending in refills for medications  - FU in 1 year or sooner if needed   Nasal polyps: - Continue Budesonide ampule's as needed twice daily  - Ok for refill when needed in November/December 2022  Follow-up: 1 year with Dr. Craige Cotta or APP

## 2021-04-23 ENCOUNTER — Telehealth: Payer: Self-pay | Admitting: Pulmonary Disease

## 2021-04-23 NOTE — Telephone Encounter (Signed)
Spoke to patient, who stated that our office sent in the incorrect dosage for symbicort. He is also requesting 90 day supply.  Per our records, Rx for symbicort 80-4.101mcg was sent in on 04/20/2021. I do not see record of symbicort 160 ever being prescribed by our office.  Spoke to [place] with [facility], who stated that Symbicort 160 was sent on 03/15/2021 and Symbicort 80 was sent in on 04/20/21. Dimas Aguas stated that [facility] filled Symbicort 160, however they have a Rx on file for 90 day supply of Symbicort 80.  Dimas Aguas will put Symbicort 160 back on the shelf and refill Symbicort 80. I explained to Dimas Aguas that I am not able to locate where our office had ever prescribed Symbicort 160. Dimas Aguas looked at the physical Rx and it was in fact written for Symbicort 80. Dimas Aguas feels that someone may had entered incorrectly in the system.   Patient is aware of above message and voiced his understanding.   Nothing further needed at this time.

## 2021-05-13 ENCOUNTER — Other Ambulatory Visit: Payer: Self-pay | Admitting: Primary Care

## 2021-07-03 ENCOUNTER — Ambulatory Visit (INDEPENDENT_AMBULATORY_CARE_PROVIDER_SITE_OTHER): Payer: Medicare Other | Admitting: Family Medicine

## 2021-07-03 ENCOUNTER — Other Ambulatory Visit: Payer: Self-pay

## 2021-07-03 ENCOUNTER — Encounter: Payer: Self-pay | Admitting: Family Medicine

## 2021-07-03 VITALS — BP 152/75 | HR 80 | Wt 192.6 lb

## 2021-07-03 DIAGNOSIS — M545 Low back pain, unspecified: Secondary | ICD-10-CM | POA: Diagnosis not present

## 2021-07-03 DIAGNOSIS — Q6102 Congenital multiple renal cysts: Secondary | ICD-10-CM

## 2021-07-03 DIAGNOSIS — E039 Hypothyroidism, unspecified: Secondary | ICD-10-CM

## 2021-07-03 LAB — URINALYSIS, ROUTINE W REFLEX MICROSCOPIC
Bilirubin, UA: NEGATIVE
Glucose, UA: NEGATIVE
Ketones, UA: NEGATIVE
Leukocytes,UA: NEGATIVE
Nitrite, UA: NEGATIVE
Protein,UA: NEGATIVE
RBC, UA: NEGATIVE
Specific Gravity, UA: >1.03 — ABNORMAL HIGH (ref 1.005–1.030)
Urobilinogen, Ur: 1 mg/dL (ref 0.2–1.0)
pH, UA: 5 (ref 5.0–7.5)

## 2021-07-03 NOTE — Assessment & Plan Note (Signed)
Will get [place] to check on stability given pain. Continue to monitor.

## 2021-07-03 NOTE — Progress Notes (Signed)
BP (!) 152/75   Pulse 80   Wt 192 lb 9.6 oz (87.4 kg)   SpO2 96%   BMI 25.41 kg/m    Subjective:    Patient ID: Richard Wolfe, male    DOB: 1951/08/06, 70 y.o.   MRN: [id number]  HPI: Richard Wolfe is a 70 y.o. male  Chief Complaint  Patient presents with   Back Pain    Patient states he is having lower left side back pain for 2 days.    BACK PAIN Duration: couple of days Mechanism of injury: no trauma Location: Left and low back Onset: sudden Severity: severe Quality: dull  Frequency: 2x in a few days, lasting 30-40 min then 15 min Radiation: none Aggravating factors: nothing Alleviating factors: nothing Status: stable Treatments attempted: none  Relief with NSAIDs?: No NSAIDs Taken Nighttime pain:  yes Paresthesias / decreased sensation:  no Bowel / bladder incontinence:  no Fevers:  no Dysuria / urinary frequency:  no  Relevant past medical, surgical, family and social history reviewed and updated as indicated. Interim medical history since our last visit reviewed. Allergies and medications reviewed and updated.  Review of Systems  Constitutional: Negative.   Respiratory: Negative.    Cardiovascular: Negative.   Gastrointestinal: Negative.   Genitourinary: Negative.   Musculoskeletal:  Positive for back pain. Negative for arthralgias, gait problem, joint swelling, myalgias, neck pain and neck stiffness.  Skin: Negative.   Neurological: Negative.   Psychiatric/Behavioral: Negative.     Per HPI unless specifically indicated above     Objective:    BP (!) 152/75   Pulse 80   Wt 192 lb 9.6 oz (87.4 kg)   SpO2 96%   BMI 25.41 kg/m   Wt Readings from Last 3 Encounters:  07/03/21 192 lb 9.6 oz (87.4 kg)  02/14/21 190 lb (86.2 kg)  01/25/21 189 lb 6.4 oz (85.9 kg)    Physical Exam Vitals and nursing note reviewed.  Constitutional:      General: He is not in acute distress.    Appearance: Normal appearance. He is not ill-appearing,  toxic-appearing or diaphoretic.  HENT:     Head: Normocephalic and atraumatic.     Right Ear: External ear normal.     Left Ear: External ear normal.     Nose: Nose normal.     Mouth/Throat:     Mouth: Mucous membranes are moist.     Pharynx: Oropharynx is clear.  Eyes:     General: No scleral icterus.       Right eye: No discharge.        Left eye: No discharge.     Extraocular Movements: Extraocular movements intact.     Conjunctiva/sclera: Conjunctivae normal.     Pupils: Pupils are equal, round, and reactive to light.  Cardiovascular:     Rate and Rhythm: Normal rate and regular rhythm.     Pulses: Normal pulses.     Heart sounds: Normal heart sounds. No murmur heard.   No friction rub. No gallop.  Pulmonary:     Effort: Pulmonary effort is normal. No respiratory distress.     Breath sounds: Normal breath sounds. No stridor. No wheezing, rhonchi or rales.  Chest:     Chest wall: No tenderness.  Musculoskeletal:        General: Normal range of motion.     Cervical back: Normal range of motion and neck supple.  Skin:    General: Skin is warm and dry.  Capillary Refill: Capillary refill takes less than 2 seconds.     Coloration: Skin is not jaundiced or pale.     Findings: No bruising, erythema, lesion or rash.  Neurological:     General: No focal deficit present.     Mental Status: He is alert and oriented to person, place, and time. Mental status is at baseline.  Psychiatric:        Mood and Affect: Mood normal.        Behavior: Behavior normal.        Thought Content: Thought content normal.        Judgment: Judgment normal.    Results for orders placed or performed during the hospital encounter of 02/16/21  NM Myocar Multi W/Spect W/Wall Motion / EF  Result Value Ref Range   Rest HR 77 bpm   Rest BP 144/80 mmHg   Percent HR 60 %   Peak HR 90 bpm   Peak BP 148/82 mmHg   TID 1.13    LV sys vol 88 mL   LV dias vol 216 62 - 150 mL      Assessment & Plan:    Problem List Items Addressed This Visit       Genitourinary   Multiple renal cysts    Will get [place] to check on stability given pain. Continue to monitor.       Relevant Orders   US Renal   Other Visit Diagnoses     Acute left-sided low back pain without sciatica    -  Primary   Likely muscular. Stretches given. Call if not getting better or getting worse.    Relevant Orders   Urinalysis, Routine w reflex microscopic   Hypothyroidism, unspecified type       Will get labs drawn as he didn't come back for labs. Drawn today.        Follow up plan: Return if symptoms worsen or fail to improve.

## 2021-07-04 LAB — T4, FREE: Free T4: 0.94 ng/dL (ref 0.82–1.77)

## 2021-07-04 LAB — TSH: TSH: 3.82 u[IU]/mL (ref 0.450–4.500)

## 2021-07-04 NOTE — Progress Notes (Signed)
Contacted via MyChart

## 2021-07-11 ENCOUNTER — Ambulatory Visit: Payer: Medicare Other

## 2021-07-12 ENCOUNTER — Ambulatory Visit
Admission: RE | Admit: 2021-07-12 | Discharge: 2021-07-12 | Disposition: A | Discharge status: Home / Self Care | Payer: Medicare Other | Source: Ambulatory Visit | Attending: Family Medicine | Admitting: Family Medicine

## 2021-07-12 ENCOUNTER — Other Ambulatory Visit: Payer: Self-pay

## 2021-07-12 ENCOUNTER — Other Ambulatory Visit (INDEPENDENT_AMBULATORY_CARE_PROVIDER_SITE_OTHER): Payer: Self-pay | Admitting: Vascular Surgery

## 2021-07-12 DIAGNOSIS — I714 Abdominal aortic aneurysm, without rupture, unspecified: Secondary | ICD-10-CM

## 2021-07-12 DIAGNOSIS — Q6102 Congenital multiple renal cysts: Secondary | ICD-10-CM | POA: Insufficient documentation

## 2021-07-13 ENCOUNTER — Ambulatory Visit (INDEPENDENT_AMBULATORY_CARE_PROVIDER_SITE_OTHER): Payer: Medicare Other | Admitting: Nurse Practitioner

## 2021-07-13 ENCOUNTER — Encounter (INDEPENDENT_AMBULATORY_CARE_PROVIDER_SITE_OTHER): Payer: Self-pay | Admitting: Nurse Practitioner

## 2021-07-13 ENCOUNTER — Ambulatory Visit (INDEPENDENT_AMBULATORY_CARE_PROVIDER_SITE_OTHER): Payer: Medicare Other

## 2021-07-13 VITALS — BP 131/81 | HR 77 | Resp 16 | Wt 194.4 lb

## 2021-07-13 DIAGNOSIS — I714 Abdominal aortic aneurysm, without rupture, unspecified: Secondary | ICD-10-CM

## 2021-07-13 DIAGNOSIS — I723 Aneurysm of iliac artery: Secondary | ICD-10-CM | POA: Diagnosis not present

## 2021-07-13 DIAGNOSIS — I10 Essential (primary) hypertension: Secondary | ICD-10-CM | POA: Diagnosis not present

## 2021-07-16 ENCOUNTER — Encounter (INDEPENDENT_AMBULATORY_CARE_PROVIDER_SITE_OTHER): Payer: Self-pay | Admitting: Nurse Practitioner

## 2021-07-16 NOTE — Progress Notes (Signed)
Subjective:    Patient ID: Richard Wolfe, male    DOB: 03/18/1951, 70 y.o.   MRN: [id number] Chief Complaint  Patient presents with   Follow-up    Ultrasound follow up    Richard Wolfe is a 70 year old male that returns to the office today for evaluation of endovascular aortic repair that was done on 12/08/2019 related to iliac artery aneurysms.  The patient's biggest complication post procedure was bruising.  Since his last follow-up he has underwent bilateral knee replacement is doing well with these.  He denies any claudication or rest pain like symptoms.  He denies any signs of peripheral embolization.  Today noninvasive studies show no evidence of endoleak.  Essentially no change from 07/13/2020.  Largest aortic diameter is 2.8 cm.     Review of Systems  Musculoskeletal:  Positive for joint swelling.  All other systems reviewed and are negative.     Objective:   Physical Exam Vitals reviewed.  HENT:     Head: Normocephalic.  Cardiovascular:     Rate and Rhythm: Normal rate.  Pulmonary:     Effort: Pulmonary effort is normal.  Skin:    General: Skin is warm and dry.  Neurological:     Mental Status: He is alert and oriented to person, place, and time.  Psychiatric:        Mood and Affect: Mood normal.        Behavior: Behavior normal.        Thought Content: Thought content normal.        Judgment: Judgment normal.    BP 131/81 (BP Location: Left Arm)   Pulse 77   Resp 16   Wt 194 lb 6.4 oz (88.2 kg)   BMI 25.65 kg/m   Past Medical History:  Diagnosis Date   Aortic regurgitation    Arthritis    Asthma    Hernia, inguinal, recurrent    Nasal polyps    Pneumonia    PONV (postoperative nausea and vomiting)     Social History   Socioeconomic History   Marital status: Married    Spouse name: Not on file   Number of children: Not on file   Years of education: Not on file   Highest education level: Not on file  Occupational History   Not on  file  Tobacco Use   Smoking status: Never   Smokeless tobacco: Never  Vaping Use   Vaping Use: Never used  Substance and Sexual Activity   Alcohol use: Yes    Alcohol/week: 2.0 standard drinks    Types: 1 Glasses of wine, 1 Cans of beer per week    Comment: socially with dinner   Drug use: Never   Sexual activity: Yes  Other Topics Concern   Not on file  Social History Narrative   Not on file   Social Determinants of Health   Financial Resource Strain: Not on file  Food Insecurity: Not on file  Transportation Needs: Not on file  Physical Activity: Not on file  Stress: Not on file  Social Connections: Not on file  Intimate Partner Violence: Not on file    Past Surgical History:  Procedure Laterality Date   CATARACT EXTRACTION W/ INTRAOCULAR LENS IMPLANT Left 10/19/2008   CATARACT EXTRACTION W/ INTRAOCULAR LENS IMPLANT Right 11/09/2008   ENDOVASCULAR REPAIR/STENT GRAFT N/A 12/08/2019   Procedure: ENDOVASCULAR REPAIR/STENT GRAFT;  Surgeon: Katha Cabal, MD;  Location: [place] [facility];  Service: Cardiovascular;  Laterality:  N/A;   EYE SURGERY     INGUINAL HERNIA REPAIR Left    1999   INGUINAL HERNIA REPAIR Right    2019   KNEE SURGERY Left    NASAL POLYP EXCISION     1981, 1986, 1993, 1994, 1995, 1996, 1998, 1999, 2001   [place]  2008   TOTAL KNEE ARTHROPLASTY Right 03/06/2021   TOTAL KNEE ARTHROPLASTY Left 09/28/2020   trigeminal nerve block  2003   TRIGEMINAL NERVE DECOMPRESSION  2002, 2003   WISDOM TOOTH EXTRACTION  1967   XI ROBOTIC ASSISTED INGUINAL HERNIA REPAIR WITH MESH Left 2019    Family History  Problem Relation Age of Onset   Rheum arthritis Mother    Cancer Father        liver   Arthritis Brother    Cancer Paternal Grandfather        throat   Cancer Brother        liver   Heart disease Half-Brother        Pacemaker/ICD   Heart failure Half-Brother    Colon cancer Paternal Aunt    Colon  cancer Paternal Uncle     Allergies  Allergen Reactions   Sulfites Swelling    Swelling of tongue and throat after eating lettuce, was told likely r/t sulfites on produce at the time   Penicillins     Convulsions Did it involve swelling of the face/tongue/throat, SOB, or low BP? No Did it involve sudden or severe rash/hives, skin peeling, or any reaction on the inside of your mouth or nose? No Did you need to seek medical attention at a hospital or doctor's office? Yes When did it last happen?       If all above answers are "NO", may proceed with cephalosporin use.     CBC Latest Ref Rng & Units 01/25/2021 12/09/2019 12/06/2019  WBC 3.4 - 10.8 x10E3/uL 4.5 10.2 4.8  Hemoglobin 13.0 - 17.7 g/dL 12.3(L) 13.3 14.9  Hematocrit 37.5 - 51.0 % 39.7 38.1(L) 45.9  Platelets 150 - 450 x10E3/uL 244 162 183      CMP     Component Value Date/Time   NA 143 01/25/2021 1045   K 4.3 01/25/2021 1045   CL 104 01/25/2021 1045   CO2 21 01/25/2021 1045   GLUCOSE 97 01/25/2021 1045   GLUCOSE 117 (H) 12/09/2019 0308   BUN 23 01/25/2021 1045   CREATININE 1.01 01/25/2021 1045   CALCIUM 9.5 01/25/2021 1045   PROT 7.6 01/25/2021 1045   ALBUMIN 4.6 01/25/2021 1045   AST 21 01/25/2021 1045   ALT 16 01/25/2021 1045   ALKPHOS 72 01/25/2021 1045   BILITOT 1.2 01/25/2021 1045   GFRNONAA >60 12/09/2019 0308   GFRAA >60 12/09/2019 0308     No results found.     Assessment & Plan:   1. Abdominal aortic aneurysm (AAA) without rupture, unspecified part Recommend: Patient is status post successful endovascular repair of the AAA.   No further intervention is required at this time.   No endoleak is detected and the aneurysm sac is stable.  The patient will continue antiplatelet therapy as prescribed as well as aggressive management of hyperlipidemia. Exercise is again strongly encouraged.   However, endografts require continued surveillance with ultrasound or CT scan. This is mandatory to detect any  changes that allow repressurization of the aneurysm sac.  The patient is informed that this would be asymptomatic.  The  patient is reminded that lifelong routine surveillance is a necessity with an endograft. Patient will continue to follow-up at 12 month intervals with ultrasound of the aorta.   2. Essential hypertension Continue antihypertensive medications as already ordered, these medications have been reviewed and there are no changes at this time.   3. Iliac artery aneurysm (HCC) Iliac artery size is stable on ultrasound today.  We will continue to monitor with EVAR surveillance as noted above.   Current Outpatient Medications on File Prior to Visit  Medication Sig Dispense Refill   ASCORBIC ACID PO Take 1,000 mg by mouth daily. With echinacea     budesonide (PULMICORT) 0.5 MG/2ML nebulizer solution Take 2 mLs (0.5 mg total) by nebulization 2 (two) times daily. 120 mL 6   budesonide-formoterol (SYMBICORT) 80-4.5 MCG/ACT inhaler INHALE 2 PUFFS BY MOUTH 2 TIME DAILY. 2 PUFFS IN THE MORNING AND 2 PUFFS AT NIGHT. RINSE MOUTH AFTER USE 10.2 each 0   Cholecalciferol (VITAMIN D3) 125 MCG (5000 UT) CAPS Take 5,000 Units by mouth daily.      Cyanocobalamin 5000 MCG SUBL Place 1 tablet under the tongue daily.     DHA-EPA-Vitamin E (OMEGA-3 COMPLEX PO) Take 1,280 mg by mouth daily.     doxycycline (PERIOSTAT) 20 MG tablet Take 20 mg by mouth 2 (two) times daily.     Folic Acid (FOLATE PO) Take 400 mcg by mouth daily.     Magnesium 400 MG CAPS Take 800 mg by mouth daily.      milk thistle 175 MG tablet Take 350 mg by mouth daily.     NON FORMULARY Black cumin seed oil 500 mg daily     Turmeric (CURCUMIN 95 PO) Take 500 mg by mouth daily.     Ubiquinol 100 MG CAPS Take 1 capsule by mouth daily.     No current facility-administered medications on file prior to visit.    There are no Patient Instructions on file for this visit. No follow-ups on file.   Georgiana Spinner, NP

## 2021-07-18 ENCOUNTER — Encounter: Payer: Self-pay | Admitting: Family Medicine

## 2021-07-27 ENCOUNTER — Encounter: Payer: Medicare Other | Admitting: Nurse Practitioner

## 2021-07-27 ENCOUNTER — Encounter: Payer: Self-pay | Admitting: Nurse Practitioner

## 2021-07-27 ENCOUNTER — Other Ambulatory Visit: Payer: Self-pay

## 2021-07-27 VITALS — Wt 193.0 lb

## 2021-07-27 NOTE — Progress Notes (Signed)
Patient not seen, had labs recent visit -- can return in May for physical.

## 2021-07-27 NOTE — Patient Instructions (Signed)

## 2021-10-23 ENCOUNTER — Telehealth: Payer: Self-pay | Admitting: Pulmonary Disease

## 2021-10-23 MED ORDER — BUDESONIDE-FORMOTEROL FUMARATE 80-4.5 MCG/ACT IN AERO: 2.0000 | INHALATION_SPRAY | Freq: Two times a day (BID) | RESPIRATORY_TRACT | 5 refills | Status: DC

## 2021-10-23 NOTE — Telephone Encounter (Signed)
Generic symbicort sent to preferred pharmacy.  Patient is aware and voiced his understanding.  Nothing further needed at this time.

## 2021-12-07 IMAGING — CT CT ANGIO CHEST
1 of 14 series · 10 of 38 positions shown · IV contrast (omnipaque)
Comparison: Report of echocardiogram September 28, 2019

CLINICAL DATA: Aortic regurgitation. Ascending thoracic aortic
prominence on recent echocardiogram abdominal pain with concern for
inguinal hernias

EXAM:
CT ANGIOGRAPHY CHEST
CT ABDOMEN AND PELVIS WITH CONTRAST
TECHNIQUE: Multidetector CT imaging of the chest was performed using the
standard protocol during bolus administration of intravenous
contrast. Multiplanar CT image reconstructions and MIPs were
obtained to evaluate the vascular anatomy. Multidetector CT imaging
of the abdomen and pelvis was performed using the standard protocol
during bolus administration of intravenous contrast. Oral contrast
also administered.
CONTRAST:  100mL OMNIPAQUE IOHEXOL 350 MG/ML SOLN

[Series 506: arterial thins cta thorax 1.00 · axial · arterial · 0.72mm/px · z∈[-1204,-897]mm · 10 of 625 slices shown]
[im 57/625  lung]
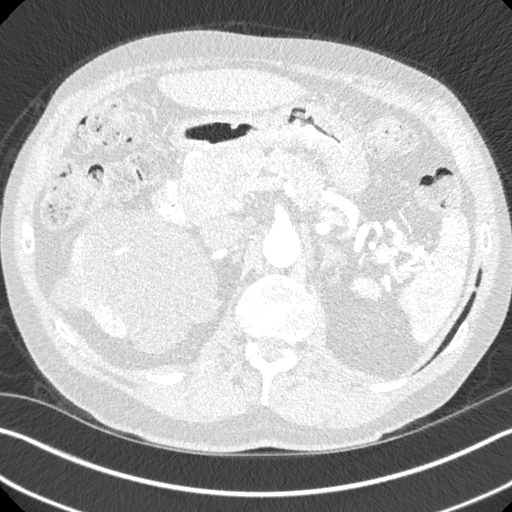
[im 114/625  mediastinal]
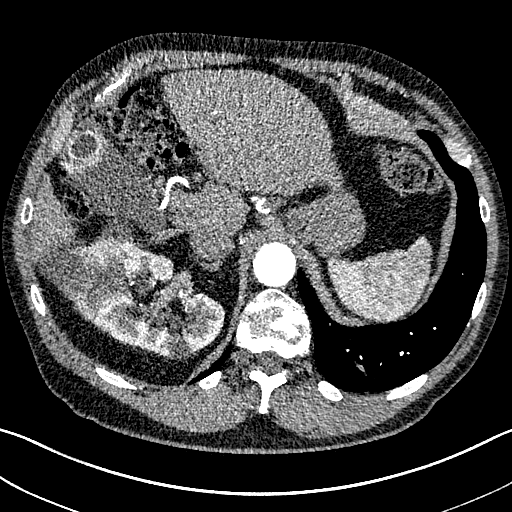
[im 171/625  lung]
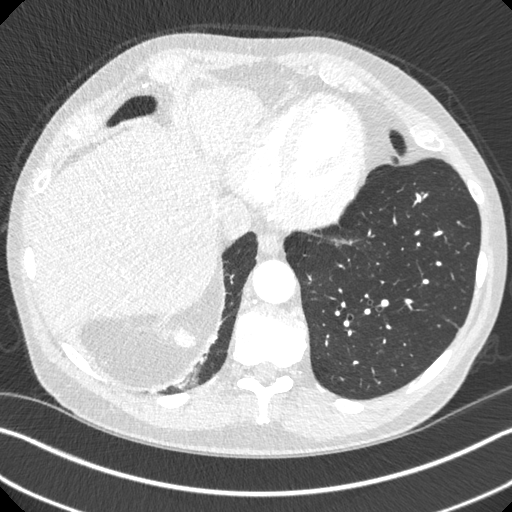
[im 227/625  mediastinal]
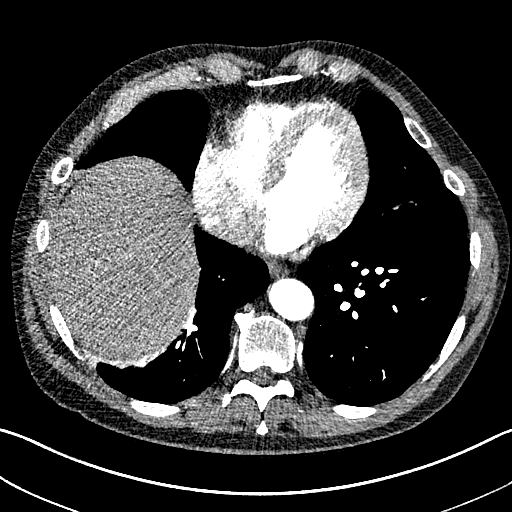
[im 284/625  lung]
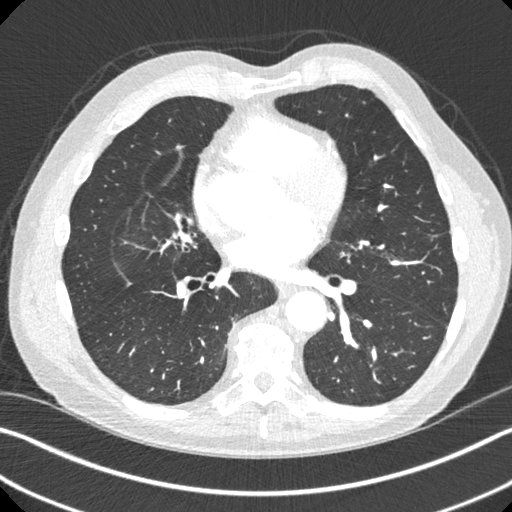
[im 341/625  mediastinal]
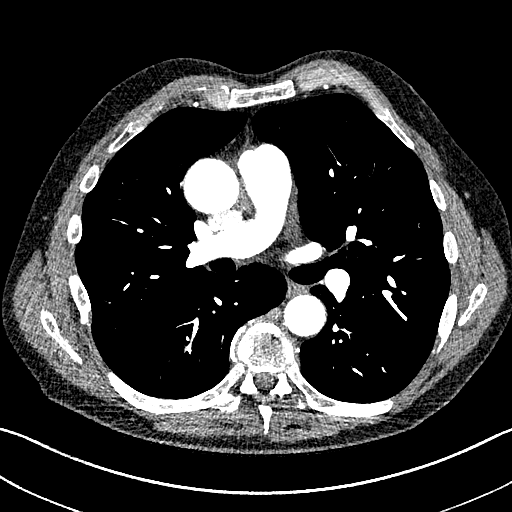
[im 398/625  lung]
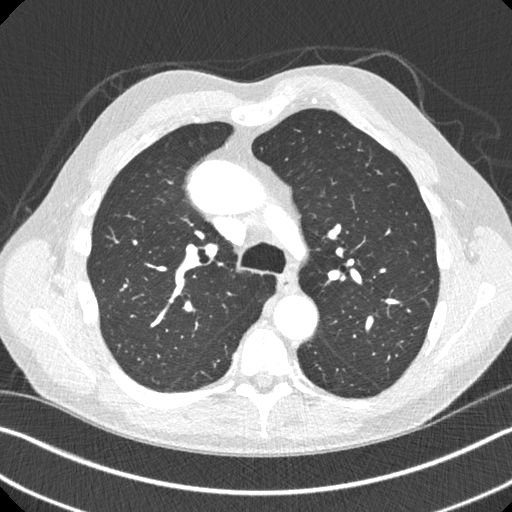
[im 454/625  mediastinal]
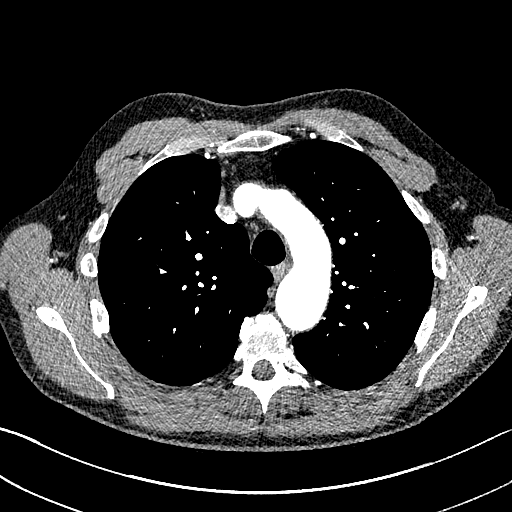
[im 511/625  lung]
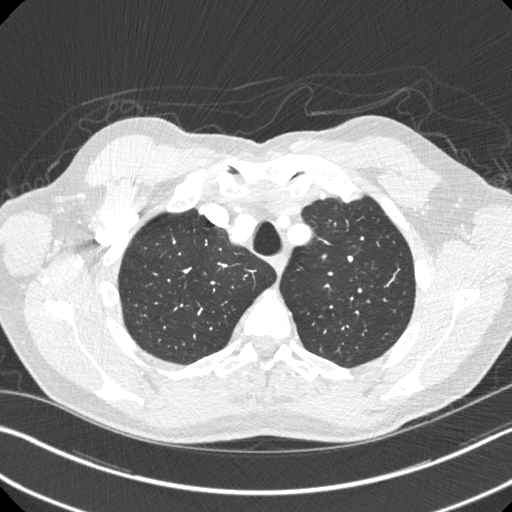
[im 568/625  mediastinal]
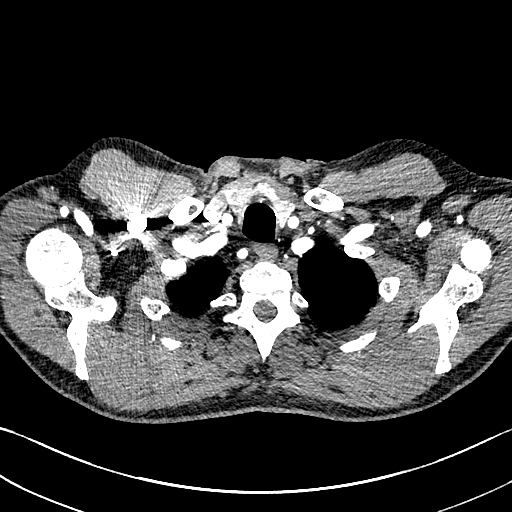

[10 of 38 positions shown; findings below may reference images not displayed]

FINDINGS: CTA CHEST FINDINGS

Cardiovascular: Ascending thoracic aortic diameter at the level of
the main pulmonary outflow tract measures 3.9 cm. Ascending thoracic
aortic diameter at the level of the sinuses of Valsalva measures
cm. Measured thoracic aortic diameter at the level of the distal
aortic arch measures 3.5 cm. Descending thoracic aortic measurement
at the main pulmonary outflow level measures 2.8 x 2.8 cm. No
evident thoracic aortic dissection. Visualized great vessels appear
unremarkable. No evident pulmonary embolus. No pericardial effusion
or pericardial thickening. There are scattered foci of coronary
artery calcification.

Mediastinum/Nodes: Visualized thyroid appears unremarkable. There is
no evident thoracic adenopathy. No esophageal lesions are
appreciable.

Lungs/Pleura: There are scattered areas of atelectatic change. There
is no evident edema or airspace opacity. No pleural effusions are
evident.

Musculoskeletal: There are foci of degenerative change in the
thoracic spine. No blastic or lytic bone lesions are evident. There
is degenerative change in each sternoclavicular joint with vacuum
phenomenon in these joints bilaterally.

Review of the MIP images confirms the above findings.

CT ABDOMEN and PELVIS FINDINGS

Hepatobiliary: No focal liver lesions are appreciable. There is a
laminated gallstone within the gallbladder. There is also sludge in
the gallbladder. The gallbladder wall does not appear appreciably
thickened. There is no evident biliary duct dilatation.

Pancreas: There is no pancreatic mass or inflammatory focus.

Spleen: No splenic lesions are evident.

Adrenals/Urinary Tract: Adrenals appear within normal limits
bilaterally. There are multiple renal cysts bilaterally. There is a
dominant cyst arising from the inferior right kidney measuring
x 10.9 cm. Next largest cyst arises in the upper to mid right kidney
measuring 7.1 x 5.0 cm. Largest cyst on the left measures 3.5 x
cm. There are several parapelvic cysts on the left. There is no
evident hydronephrosis on either side. There is no appreciable renal
or ureteral calculus on either side. Urinary bladder is midline.
Urinary bladder wall is upper normal in thickness.

Stomach/Bowel: There is no appreciable bowel wall or mesenteric
thickening. There is moderate stool throughout the colon. Terminal
ileum appears normal. There is no evident bowel obstruction. There
is no free air or portal venous air.

Vascular/Lymphatic: Aorta is somewhat tortuous. There is no
abdominal aortic aneurysm. There is, however, localized aneurysmal
dilatation of the common iliac artery with aneurysm in this area
measuring 2.9 x 2.3 cm. There is aortic and iliac artery
atherosclerosis. No adenopathy evident in the abdomen or pelvis.

Reproductive: There are prostatic calcifications. Prostate and
seminal vesicles are normal in size and configuration. No pelvic
masses are evident. There is an apparent prosthesis in the left
scrotal sac. There is a hydrocele involving the right scrotum with
fluid from the right scrotum tracking into the lower inguinal ring.

Other: There is soft tissue opacity in the right inguinal ring.
Question previous repair in this area. No bowel containing hernia
evident. There is no periappendiceal region inflammatory change. No
evident abscess or ascites in the abdomen or pelvis.

Musculoskeletal: There is degenerative change in the lumbar spine.
No blastic or lytic bone lesions. No intramuscular lesions are
evident.

Review of the MIP images confirms the above findings.
IMPRESSION: CT angiogram chest:

1. Ascending thoracic aortic diameter at the sinuses of Valsalva is
4.2 cm. Transverse diameter in the distal aortic arch is 3.5 cm.
Ascending thoracic aortic diameter in the mid ascending thoracic
region measures 3.9 x 3.9 cm. Recommend annual imaging followup by
CTA or MRA. This recommendation follows 8535
ACCF/AHA/AATS/ACR/ASA/SCA/EN/HASSANHASSAN/DAWKINS/VICKI Guidelines for the
Diagnosis and Management of Patients with Thoracic Aortic Disease.
Circulation.8535; 121: E266-e369. Aortic aneurysm NOS (B9WY1-8KQ.N).
There are foci of coronary artery calcification.

2. Scattered areas of atelectatic change. No edema or airspace
opacity.

3.  No evident adenopathy.

4.  No demonstrable pulmonary embolus.

CT abdomen and pelvis:

1.  Cholelithiasis.  No appreciable gallbladder wall thickening.

2. 2.9 x 2.3 cm left common iliac artery aneurysm, partially
calcified. There is aortic and iliac artery atherosclerosis.

3. Multiple renal cysts with a dominant cyst arising from the lower
pole of the right kidney measuring 11.9 x 10.9 cm. No
hydronephrosis. No renal or ureteral calculus.

4. Urinary bladder wall thickness upper normal. Advise correlation
with urinalysis to exclude possible earliest changes of cystitis.

5. Question postoperative change in the right inguinal region. No
bowel containing hernia evident.

6. There is a right scrotal hydrocele with fluid tracking from the
right scrotum into the lower inguinal ring. Apparent prosthesis in
left scrotum.

7. No bowel obstruction. No abscess in the abdomen or pelvis. No
periappendiceal region inflammatory change.

Aortic Atherosclerosis ([id number].B).

## 2022-01-10 ENCOUNTER — Telehealth: Payer: Self-pay | Admitting: Internal Medicine

## 2022-01-10 NOTE — Telephone Encounter (Signed)
Made 3 attempts to schedule, removing from recall list. 

## 2022-02-19 ENCOUNTER — Other Ambulatory Visit (HOSPITAL_COMMUNITY): Payer: Self-pay

## 2022-02-19 MED ORDER — BUDESONIDE-FORMOTEROL FUMARATE 80-4.5 MCG/ACT IN AERO
INHALATION_SPRAY | RESPIRATORY_TRACT | 0 refills | Status: DC
  Filled 2022-02-19: qty 10.2, 30d supply, fill #0

## 2022-03-26 ENCOUNTER — Telehealth (INDEPENDENT_AMBULATORY_CARE_PROVIDER_SITE_OTHER): Payer: Self-pay

## 2022-03-26 NOTE — Telephone Encounter (Signed)
Pt LVM stating he is experiencing lower back and abdominal pain on the left side for about a month.  He states he was told after stent surgery if he had these symptoms he needed to call to be seen to make sure the stents had not moved or anything.  Pt has appt Nov 21st but thinks he should be seen sooner.  Please advise how soon appt should be made.

## 2022-03-26 NOTE — Telephone Encounter (Signed)
Stents typically don't move, what we typically worry about is the aneurysm growing. However we can bring him sooner for an ultrasound only to determine if their has been growth, if it is abnormal, we can work him

## 2022-03-27 ENCOUNTER — Other Ambulatory Visit (INDEPENDENT_AMBULATORY_CARE_PROVIDER_SITE_OTHER): Payer: Self-pay | Admitting: Nurse Practitioner

## 2022-03-27 DIAGNOSIS — I714 Abdominal aortic aneurysm, without rupture, unspecified: Secondary | ICD-10-CM

## 2022-03-28 ENCOUNTER — Ambulatory Visit (INDEPENDENT_AMBULATORY_CARE_PROVIDER_SITE_OTHER): Payer: Medicare Other

## 2022-03-28 DIAGNOSIS — I714 Abdominal aortic aneurysm, without rupture, unspecified: Secondary | ICD-10-CM | POA: Diagnosis not present

## 2022-04-03 ENCOUNTER — Telehealth: Payer: Self-pay | Admitting: Nurse Practitioner

## 2022-04-03 NOTE — Telephone Encounter (Signed)
Copied from CRM [id number]. Topic: Appointment Scheduling - Scheduling Inquiry for Clinic >> Apr 03, 2022  4:30 PM Pincus Sanes wrote: Reason for CRM: Pt has called in that he was called today and asked to Schedule his AWV on behalf of [name], upon questioning he had had a note put in his chart that states he only gets those visits from [place]. He says she does every year but do not see where he has done seen 2021. Pls advise pt as not sure how she is doing, states annual wellness always been done by her. Did not want to sch visit wrong as he was upset already as frustrated that AWV calls are continuing. Assuming Office vist but wanted to make sure FU [phone]

## 2022-04-06 DIAGNOSIS — Z96652 Presence of left artificial knee joint: Secondary | ICD-10-CM | POA: Insufficient documentation

## 2022-04-06 NOTE — Patient Instructions (Signed)
Asthma, Adult  Asthma is a condition that causes swelling and narrowing of the airways. These are the passages that lead from the nose and mouth down into the lungs. When asthma symptoms get worse it is called an asthma attack or flare. This can make it hard to breathe. Asthma flares can range from minor to life-threatening. There is no cure for asthma, but medicines and lifestyle changes can help to control it. What are the causes? It is not known exactly what causes asthma, but certain things can cause asthma symptoms to get worse (triggers). What can trigger an asthma attack? Cigarette smoke. Mold. Dust. Your pet's skin flakes (dander). Cockroaches. Pollen. Air pollution (like household cleaners, wood smoke, smog, or chemical odors). What are the signs or symptoms? Trouble breathing (shortness of breath). Coughing. Making high-pitched whistling sounds when you breathe, most often when you breathe out (wheezing). Chest tightness. Tiredness with little activity. Poor exercise tolerance. How is this treated? Controller medicines that help prevent asthma symptoms. Fast-acting reliever or rescue medicines. These give short-term relief of asthma symptoms. Allergy medicines if your attacks are brought on by allergens. Medicines to help control the body's defense (immune) system. Staying away from the things that cause asthma attacks. Follow these instructions at home: Avoiding triggers in your home Do not allow anyone to smoke in your home. Limit use of fireplaces and wood stoves. Get rid of pests (such as roaches and mice) and their droppings. Keep your home clean. Clean your floors. Dust regularly. Use cleaning products that do not smell. Wash bed sheets and blankets every week in hot water. Dry them in a dryer. Have someone vacuum when you are not home. Change your heating and air conditioning filters often. Use blankets that are made of polyester or cotton. General  instructions Take over-the-counter and prescription medicines only as told by your doctor. Do not smoke or use any products that contain nicotine or tobacco. If you need help quitting, ask your doctor. Stay away from secondhand smoke. Avoid doing things outdoors when allergen counts are high and when air quality is low. Warm up before you exercise. Take time to cool down after exercise. Use a peak flow meter as told by your doctor. A peak flow meter is a tool that measures how well your lungs are working. Keep track of the peak flow meter's readings. Write them down. Follow your asthma action plan. This is a written plan for taking care of your asthma and treating your attacks. Make sure you get all the shots (vaccines) that your doctor recommends. Ask your doctor about a flu shot and a pneumonia shot. Keep all follow-up visits. Contact a doctor if: You have wheezing, shortness of breath, or a cough even while taking medicine to prevent attacks. The mucus you cough up (sputum) is thicker than usual. The mucus you cough up changes from clear or white to yellow, green, gray, or is bloody. You have problems from the medicine you are taking, such as: A rash. Itching. Swelling. Trouble breathing. You need reliever medicines more than 2-3 times a week. Your peak flow reading is still at 50-79% of your personal best after following the action plan for 1 hour. You have a fever. Get help right away if: You seem to be worse and are not responding to medicine during an asthma attack. You are short of breath even at rest. You get short of breath when doing very little activity. You have trouble eating, drinking, or talking. You have chest   pain or tightness. You have a fast heartbeat. Your lips or fingernails start to turn blue. You are light-headed or dizzy, or you faint. Your peak flow is less than 50% of your personal best. You feel too tired to breathe normally. These symptoms may be an  emergency. Get help right away. Call 911. Do not wait to see if the symptoms will go away. Do not drive yourself to the hospital. Summary Asthma is a long-term (chronic) condition in which the airways get tight and narrow. An asthma attack can make it hard to breathe. Asthma cannot be cured, but medicines and lifestyle changes can help control it. Make sure you understand how to avoid triggers and how and when to use your medicines. Avoid things that can cause allergy symptoms (allergens). These include animal skin flakes (dander) and pollen from trees or grass. Avoid things that pollute the air. These may include household cleaners, wood smoke, smog, or chemical odors. This information is not intended to replace advice given to you by your health care provider. Make sure you discuss any questions you have with your health care provider. Document Revised: 05/21/2021 Document Reviewed: 05/21/2021 Elsevier Patient Education  2023 [employer]  

## 2022-04-10 ENCOUNTER — Encounter: Payer: Self-pay | Admitting: Nurse Practitioner

## 2022-04-10 ENCOUNTER — Ambulatory Visit (INDEPENDENT_AMBULATORY_CARE_PROVIDER_SITE_OTHER): Payer: Medicare Other | Admitting: Nurse Practitioner

## 2022-04-10 VITALS — BP 136/64 | HR 79 | Temp 97.7°F | Wt 200.0 lb

## 2022-04-10 DIAGNOSIS — I340 Nonrheumatic mitral (valve) insufficiency: Secondary | ICD-10-CM

## 2022-04-10 DIAGNOSIS — J449 Chronic obstructive pulmonary disease, unspecified: Secondary | ICD-10-CM | POA: Diagnosis not present

## 2022-04-10 DIAGNOSIS — I723 Aneurysm of iliac artery: Secondary | ICD-10-CM

## 2022-04-10 DIAGNOSIS — G4733 Obstructive sleep apnea (adult) (pediatric): Secondary | ICD-10-CM

## 2022-04-10 DIAGNOSIS — J4489 Other specified chronic obstructive pulmonary disease: Secondary | ICD-10-CM

## 2022-04-10 DIAGNOSIS — I7122 Aneurysm of the aortic arch, without rupture: Secondary | ICD-10-CM | POA: Diagnosis not present

## 2022-04-10 DIAGNOSIS — I700 Atherosclerosis of aorta: Secondary | ICD-10-CM

## 2022-04-10 DIAGNOSIS — I351 Nonrheumatic aortic (valve) insufficiency: Secondary | ICD-10-CM

## 2022-04-10 DIAGNOSIS — I872 Venous insufficiency (chronic) (peripheral): Secondary | ICD-10-CM

## 2022-04-10 DIAGNOSIS — J310 Chronic rhinitis: Secondary | ICD-10-CM

## 2022-04-10 DIAGNOSIS — I10 Essential (primary) hypertension: Secondary | ICD-10-CM

## 2022-04-10 DIAGNOSIS — J339 Nasal polyp, unspecified: Secondary | ICD-10-CM

## 2022-04-10 DIAGNOSIS — N400 Enlarged prostate without lower urinary tract symptoms: Secondary | ICD-10-CM

## 2022-04-10 MED ORDER — ALBUTEROL SULFATE HFA 108 (90 BASE) MCG/ACT IN AERS: 2.0000 | INHALATION_SPRAY | Freq: Four times a day (QID) | RESPIRATORY_TRACT | 4 refills | Status: DC | PRN

## 2022-04-10 NOTE — Assessment & Plan Note (Signed)
Chronic, ongoing.  Continue current medication regimen and adjust as needed.  Continue collaboration with pulmonary and ENT, notes reviewed.

## 2022-04-10 NOTE — Assessment & Plan Note (Signed)
Continue current collaboration with cardiology, recent notes reviewed.  He is going to be transitioning to new cardiologist.

## 2022-04-10 NOTE — Assessment & Plan Note (Signed)
Chronic, stable.  Recommend to continue current medication regimen, would benefit from statin but refuses at this time.  ASCVD 21.2%, have discussed multiple times with patient.  Continue compression hose on during day and off at night.

## 2022-04-10 NOTE — Assessment & Plan Note (Signed)
Not using CPAP, wishes not to return to use at this time.  Discussed with patient.

## 2022-04-10 NOTE — Assessment & Plan Note (Addendum)
Surgery in April 2021, stent.  Continue DAPT per vascular instruction and collaboration with vascular.  Recent notes and imaging reviewed.

## 2022-04-10 NOTE — Assessment & Plan Note (Signed)
Chronic, ongoing.  Continue current medication regimen and adjust as needed.  Continue collaboration with ENT.

## 2022-04-10 NOTE — Assessment & Plan Note (Signed)
Chronic, stable.  No current treatment, initial BP elevated, but repeat improved. Has known white coat syndrome at baseline and home BP checks are stable. Recommend he monitor BP at least a few mornings a week at home and document.  DASH diet at home. Labs today: CBC, CMP, Lipid, TSH.  Return in annually, sees multiple specialists throughout year.

## 2022-04-10 NOTE — Assessment & Plan Note (Signed)
Continue collaboration with cardiology, recent notes reviewed.  He is going to be transitioning to new cardiologist.

## 2022-04-10 NOTE — Assessment & Plan Note (Addendum)
Chronic, stable.  Continue current medication regimen and collaboration with ENT.

## 2022-04-10 NOTE — Assessment & Plan Note (Signed)
Stable. Noted on imaging 10/12/19.  Recommend statin, patient declines.  Continue Plavix and ASA daily for prevention.

## 2022-04-10 NOTE — Progress Notes (Signed)
BP 136/64 (BP Location: Left Arm, Patient Position: Sitting, Cuff Size: Normal)   Pulse 79   Temp 97.7 F (36.5 C) (Oral)   Wt 200 lb (90.7 kg)   SpO2 95%   BMI 26.39 kg/m    Subjective:    Patient ID: Richard Wolfe, male    DOB: March 08, 1951, 71 y.o.   MRN: [id number]  HPI: Richard Wolfe is a 70 y.o. male presenting on 04/10/2022 for annual check up. Current medical complaints include:none  He currently lives with: wife Interim Problems from his last visit: no  AORTIC INSUFFICIENCY: Followed by Dr. Okey Dupre, last seen 02/06/21, has been referred to new cardiologist at [facility] by ortho due to relationship with current provider. Had echo in 02/16/21 with EF 45 to 50%, LVH mildly decreased from previous.  Aortic atherosclerosis noted on imaging 10/12/19.   Followed by Dr. Gilda Crease for iliac artery aneurysm, had repair and stent placed 12/08/19. Last vascular visit on 07/13/21 -- is to continue DAPT. Home BP levels range 120-130/60-70 -- has white coat syndrome at baseline.  Two months ago went to play pickle ball -- started to have issues with neck and lower back, a referral was placed to physiatry by vascular -- sees them September 19th.   He reports after pickle ball incident had a bulge feeling to groin area, left side, vascular performed imaging and stents are stable. Aspirin: yes Recurrent headaches: no Visual changes: no Palpitations: no Dyspnea: no Chest pain: no Lower extremity edema: no Dizzy/lightheaded: no The 10-year ASCVD risk score (Arnett DK, et al., 2019) is: 21.2%   Values used to calculate the score:     Age: 46 years     Sex: Male     Is Non-Hispanic African American: No     Diabetic: No     Tobacco smoker: No     Systolic Blood Pressure: 136 mmHg     Is BP treated: No     HDL Cholesterol: 44 mg/dL     Total Cholesterol: 152 mg/dL  ASTHMA Saw pulmonary 7/51/02 -- returns end of this month.  Has had this since childhood. Uses Symbicort inhaler daily, was on  160, but pulmonary reduced him to 80 dosing -- he feels he may be better going back to 160. Has Albuterol inhaler, but rarely uses.  States he fills it once a year.  Has not been using nebulizer. Saw ENT 2 weeks ago.   Has not returned to using CPAP. Asthma status: stable Satisfied with current treatment?: yes Albuterol/rescue inhaler frequency: once or twice a year Dyspnea frequency: none Wheezing frequency: none Cough frequency: none Nocturnal symptom frequency: none Limitation of activity: no Current upper respiratory symptoms: no Triggers: exercise Home peak flows:none Last Spirometry:  Failed/intolerant to following asthma meds:  Asthma meds in past:  Aerochamber/spacer use: no Visits to ER or Urgent Care in past year: no Pneumovax: Up to Date Influenza: Up to Date   Functional Status Survey: Is the patient deaf or have difficulty hearing?: No Does the patient have difficulty seeing, even when wearing glasses/contacts?: No Does the patient have difficulty concentrating, remembering, or making decisions?: No Does the patient have difficulty walking or climbing stairs?: No Does the patient have difficulty dressing or bathing?: No Does the patient have difficulty doing errands alone such as visiting a doctor's office or shopping?: No  FALL RISK:    04/10/2022    9:25 AM 01/25/2021    9:59 AM 10/13/2019    2:04 PM 09/29/2019  3:44 PM 08/09/2019    1:33 PM  Fall Risk   Falls in the past year? 0 0 0 0 0  Number falls in past yr: 0 0   0  Injury with Fall? 0 0   0  Risk for fall due to : No Fall Risks Other (Comment)     Follow up Falls evaluation completed Falls evaluation completed   Falls evaluation completed    Depression Screen    04/10/2022    9:25 AM 01/25/2021   10:00 AM 08/09/2019    1:33 PM  Depression screen PHQ 2/9  Decreased Interest 0 0 0  Down, Depressed, Hopeless 0 0 0  PHQ - 2 Score 0 0 0  Altered sleeping 0    Tired, decreased energy 0    Change in  appetite 0    Feeling bad or failure about yourself  0    Trouble concentrating 0    Moving slowly or fidgety/restless 0    Suicidal thoughts 0    PHQ-9 Score 0    Difficult doing work/chores Not difficult at all     Advanced Directives <no information>  Past Medical History:  Past Medical History:  Diagnosis Date   Aortic regurgitation    Arthritis    Asthma    Hernia, inguinal, recurrent    Nasal polyps    Pneumonia    PONV (postoperative nausea and vomiting)     Surgical History:  Past Surgical History:  Procedure Laterality Date   CATARACT EXTRACTION W/ INTRAOCULAR LENS IMPLANT Left 10/19/2008   CATARACT EXTRACTION W/ INTRAOCULAR LENS IMPLANT Right 11/09/2008   ENDOVASCULAR REPAIR/STENT GRAFT N/A 12/08/2019   Procedure: ENDOVASCULAR REPAIR/STENT GRAFT;  Surgeon: Katha Cabal, MD;  Location: [facility];  Service: Cardiovascular;  Laterality: N/A;   EYE SURGERY     INGUINAL HERNIA REPAIR Left    1999   INGUINAL HERNIA REPAIR Right    2019   KNEE SURGERY Left    NASAL POLYP EXCISION     1981, 1986, 1993, 1994, 1995, 1996, 1998, 1999, 2001   [place]  2008   TOTAL KNEE ARTHROPLASTY Right 03/06/2021   TOTAL KNEE ARTHROPLASTY Left 09/28/2020   trigeminal nerve block  2003   TRIGEMINAL NERVE DECOMPRESSION  2002, 2003   WISDOM TOOTH EXTRACTION  1967   XI ROBOTIC ASSISTED INGUINAL HERNIA REPAIR WITH MESH Left 2019    Medications:  Current Outpatient Medications on File Prior to Visit  Medication Sig   ASCORBIC ACID PO Take 1,000 mg by mouth daily. With echinacea   budesonide (PULMICORT) 0.5 MG/2ML nebulizer solution Take 2 mLs (0.5 mg total) by nebulization 2 (two) times daily.   budesonide-formoterol (SYMBICORT) 80-4.5 MCG/ACT inhaler Inhale 2 puffs into the lungs in the morning and at bedtime.   Cholecalciferol (VITAMIN D3) 125 MCG (5000 UT) CAPS Take 5,000 Units by mouth daily.    Cyanocobalamin 5000 MCG  SUBL Place 1 tablet under the tongue daily.   DHA-EPA-Vitamin E (OMEGA-3 COMPLEX PO) Take 1,280 mg by mouth daily.   doxycycline (PERIOSTAT) 20 MG tablet Take 20 mg by mouth as needed.   Folic Acid (FOLATE PO) Take 400 mcg by mouth daily.   Magnesium 400 MG CAPS Take 800 mg by mouth daily.    milk thistle 175 MG tablet Take 350 mg by mouth daily.   Turmeric (CURCUMIN 95 PO) Take 500 mg by mouth daily.  Ubiquinol 100 MG CAPS Take 1 capsule by mouth daily.   No current facility-administered medications on file prior to visit.    Allergies:  Allergies  Allergen Reactions   Sulfites Swelling    Swelling of tongue and throat after eating lettuce, was told likely r/t sulfites on produce at the time   Penicillins     Convulsions Did it involve swelling of the face/tongue/throat, SOB, or low BP? No Did it involve sudden or severe rash/hives, skin peeling, or any reaction on the inside of your mouth or nose? No Did you need to seek medical attention at a hospital or doctor's office? Yes When did it last happen?       If all above answers are "NO", may proceed with cephalosporin use.     Social History:  Social History   Socioeconomic History   Marital status: Married    Spouse name: Not on file   Number of children: Not on file   Years of education: Not on file   Highest education level: Not on file  Occupational History   Not on file  Tobacco Use   Smoking status: Never   Smokeless tobacco: Never  Vaping Use   Vaping Use: Never used  Substance and Sexual Activity   Alcohol use: Yes    Alcohol/week: 2.0 standard drinks of alcohol    Types: 1 Glasses of wine, 1 Cans of beer per week    Comment: socially with dinner   Drug use: Never   Sexual activity: Yes  Other Topics Concern   Not on file  Social History Narrative   Not on file   Social Determinants of Health   Financial Resource Strain: Low Risk  (08/09/2019)   Overall Financial Resource Strain (CARDIA)     Difficulty of Paying Living Expenses: Not hard at all  Food Insecurity: No Food Insecurity (08/09/2019)   Hunger Vital Sign    Worried About Running Out of Food in the Last Year: Never true    Ran Out of Food in the Last Year: Never true  Transportation Needs: No Transportation Needs (08/09/2019)   PRAPARE - Hydrologist (Medical): No    Lack of Transportation (Non-Medical): No  Physical Activity: Sufficiently Active (08/09/2019)   Exercise Vital Sign    Days of Exercise per Week: 5 days    Minutes of Exercise per Session: 30 min  Stress: No Stress Concern Present (08/09/2019)   [place]    Feeling of Stress : Not at all  Social Connections: Moderately Isolated (08/09/2019)   Social Connection and Isolation Panel [NHANES]    Frequency of Communication with Friends and Family: More than three times a week    Frequency of Social Gatherings with Friends and Family: More than three times a week    Attends Religious Services: Never    Marine scientist or Organizations: No    Attends Archivist Meetings: Never    Marital Status: Married  Human resources officer Violence: Not At Risk (08/09/2019)   Humiliation, Afraid, Rape, and Kick questionnaire    Fear of Current or Ex-Partner: No    Emotionally Abused: No    Physically Abused: No    Sexually Abused: No   Social History   Tobacco Use  Smoking Status Never  Smokeless Tobacco Never   Social History   Substance and Sexual Activity  Alcohol Use Yes   Alcohol/week: 2.0 standard drinks  of alcohol   Types: 1 Glasses of wine, 1 Cans of beer per week   Comment: socially with dinner    Family History:  Family History  Problem Relation Age of Onset   Rheum arthritis Mother    Cancer Father        liver   Arthritis Brother    Cancer Paternal Grandfather        throat   Cancer Brother        liver   Heart disease  Half-Brother        Pacemaker/ICD   Heart failure Half-Brother    Colon cancer Paternal Aunt    Colon cancer Paternal Uncle     Past medical history, surgical history, medications, allergies, family history and social history reviewed with patient today and changes made to appropriate areas of the chart.   ROS All other ROS negative except what is listed above and in the HPI.      Objective:    BP 136/64 (BP Location: Left Arm, Patient Position: Sitting, Cuff Size: Normal)   Pulse 79   Temp 97.7 F (36.5 C) (Oral)   Wt 200 lb (90.7 kg)   SpO2 95%   BMI 26.39 kg/m   Wt Readings from Last 3 Encounters:  04/10/22 200 lb (90.7 kg)  07/27/21 193 lb (87.5 kg)  07/13/21 194 lb 6.4 oz (88.2 kg)    No results found.  Physical Exam Vitals and nursing note reviewed.  Constitutional:      General: He is awake. He is not in acute distress.    Appearance: He is well-developed and well-groomed. He is not ill-appearing or toxic-appearing.  HENT:     Head: Normocephalic and atraumatic.     Right Ear: Hearing, tympanic membrane, ear canal and external ear normal. No drainage.     Left Ear: Hearing, tympanic membrane, ear canal and external ear normal. No drainage.     Nose: Nose normal.     Mouth/Throat:     Pharynx: Uvula midline.  Eyes:     General: Lids are normal.        Right eye: No discharge.        Left eye: No discharge.     Extraocular Movements: Extraocular movements intact.     Conjunctiva/sclera: Conjunctivae normal.     Pupils: Pupils are equal, round, and reactive to light.     Visual Fields: Right eye visual fields normal and left eye visual fields normal.  Neck:     Thyroid: No thyromegaly.     Vascular: No carotid bruit or JVD.     Trachea: Trachea normal.  Cardiovascular:     Rate and Rhythm: Normal rate and regular rhythm.     Heart sounds: Normal heart sounds, S1 normal and S2 normal. No murmur heard.    No gallop.  Pulmonary:     Effort: Pulmonary effort  is normal. No accessory muscle usage or respiratory distress.     Breath sounds: Normal breath sounds.  Abdominal:     General: Bowel sounds are normal.     Palpations: Abdomen is soft. There is no hepatomegaly or splenomegaly.     Tenderness: There is no abdominal tenderness.  Musculoskeletal:        General: Normal range of motion.     Cervical back: Normal range of motion and neck supple.     Right lower leg: No edema.     Left lower leg: No edema.  Lymphadenopathy:  Head:     Right side of head: No submental, submandibular, tonsillar, preauricular or posterior auricular adenopathy.     Left side of head: No submental, submandibular, tonsillar, preauricular or posterior auricular adenopathy.     Cervical: No cervical adenopathy.  Skin:    General: Skin is warm and dry.     Capillary Refill: Capillary refill takes less than 2 seconds.     Findings: No rash.  Neurological:     Mental Status: He is alert and oriented to person, place, and time.     Gait: Gait is intact.     Deep Tendon Reflexes: Reflexes are normal and symmetric.     Reflex Scores:      Brachioradialis reflexes are 2+ on the right side and 2+ on the left side.      Patellar reflexes are 2+ on the right side and 2+ on the left side. Psychiatric:        Attention and Perception: Attention normal.        Mood and Affect: Mood normal.        Speech: Speech normal.        Behavior: Behavior normal. Behavior is cooperative.        Thought Content: Thought content normal.        Cognition and Memory: Cognition normal.       04/10/2022   10:00 AM 01/03/2020    2:45 PM  6CIT Screen  What Year? 0 points 0 points  What month? 0 points 0 points  What time? 0 points 0 points  Count back from 20 0 points 0 points  Months in reverse 0 points 0 points  Repeat phrase 0 points 8 points  Total Score 0 points 8 points   Results for orders placed or performed in visit on 07/03/21  Urinalysis, Routine w reflex  microscopic  Result Value Ref Range   Specific Gravity, UA >1.030 (H) 1.005 - 1.030   pH, UA 5.0 5.0 - 7.5   Color, UA Yellow Yellow   Appearance Ur Clear Clear   Leukocytes,UA Negative Negative   Protein,UA Negative Negative/Trace   Glucose, UA Negative Negative   Ketones, UA Negative Negative   RBC, UA Negative Negative   Bilirubin, UA Negative Negative   Urobilinogen, Ur 1.0 0.2 - 1.0 mg/dL   Nitrite, UA Negative Negative  TSH  Result Value Ref Range   TSH 3.820 0.450 - 4.500 uIU/mL  T4, free  Result Value Ref Range   Free T4 0.94 0.82 - 1.77 ng/dL      Assessment & Plan:   Problem List Items Addressed This Visit       Cardiovascular and Mediastinum   Aortic atherosclerosis ([place])    Stable. Noted on imaging 10/12/19.  Recommend statin, patient declines.  Continue Plavix and ASA daily for prevention.      Relevant Orders   Comprehensive metabolic panel   Lipid Panel w/o Chol/HDL Ratio   Essential hypertension    Chronic, stable.  No current treatment, initial BP elevated, but repeat improved. Has known white coat syndrome at baseline and home BP checks are stable. Recommend he monitor BP at least a few mornings a week at home and document.  DASH diet at home. Labs today: CBC, CMP, Lipid, TSH.  Return in annually, sees multiple specialists throughout year.       Relevant Orders   Comprehensive metabolic panel   TSH   Iliac artery aneurysm, left [employer])    Surgery in  April 2021, stent.  Continue DAPT per vascular instruction and collaboration with vascular.  Recent notes and imaging reviewed.      Relevant Orders   Comprehensive metabolic panel   Lipid Panel w/o Chol/HDL Ratio   Mild mitral regurgitation by prior echocardiogram    Continue current collaboration with cardiology, recent notes reviewed.  He is going to be transitioning to new cardiologist.      Nonrheumatic aortic valve insufficiency    Continue collaboration with cardiology, recent notes reviewed.   He is going to be transitioning to new cardiologist.      Relevant Orders   Comprehensive metabolic panel   Lipid Panel w/o Chol/HDL Ratio   Peripheral venous insufficiency    Chronic, stable.  Recommend to continue current medication regimen, would benefit from statin but refuses at this time.  ASCVD 21.2%, have discussed multiple times with patient.  Continue compression hose on during day and off at night.      Relevant Orders   CBC with Differential/Platelet   RESOLVED: Thoracic aortic aneurysm without rupture (HCC)   Relevant Orders   Comprehensive metabolic panel   Lipid Panel w/o Chol/HDL Ratio     Respiratory   Chronic rhinitis    Chronic, ongoing.  Continue current medication regimen and adjust as needed.  Continue collaboration with ENT.      Relevant Orders   CBC with Differential/Platelet   COPD with asthma ([place]) - Primary    Chronic, ongoing.  Continue current medication regimen and adjust as needed.  Continue collaboration with pulmonary and ENT, notes reviewed.      Relevant Medications   albuterol (VENTOLIN HFA) 108 (90 Base) MCG/ACT inhaler   Other Relevant Orders   CBC with Differential/Platelet   Nasal polyps    Chronic, stable.  Continue current medication regimen and collaboration with ENT.        OSA (obstructive sleep apnea)    Not using CPAP, wishes not to return to use at this time.  Discussed with patient.      Other Visit Diagnoses     Benign prostatic hyperplasia without lower urinary tract symptoms       Relevant Orders   PSA      Discussed aspirin prophylaxis for myocardial infarction prevention and decision was it was not indicated  LABORATORY TESTING:  Health maintenance labs ordered today as discussed above.   The natural history of prostate cancer and ongoing controversy regarding screening and potential treatment outcomes of prostate cancer has been discussed with the patient. The meaning of a false positive PSA and a false  negative PSA has been discussed. He indicates understanding of the limitations of this screening test and wishes to proceed with screening PSA testing. Is aware of age recommendations, but wishes to continue monitoring.  IMMUNIZATIONS:   - Tdap: Tetanus vaccination status reviewed: Refuses. - Influenza: Refused - Pneumovax: Up to date - Prevnar: Up to date - Zostavax vaccine: Refused  SCREENING: - Colonoscopy: Refuses -- attempted and prep did not work, so does not want to return  Discussed with patient purpose of the colonoscopy is to detect colon cancer at curable precancerous or early stages   - AAA Screening: Up to date  -Hearing Test: Not applicable  -Spirometry: Up to date   PATIENT COUNSELING:    Sexuality: Discussed sexually transmitted diseases, partner selection, use of condoms, avoidance of unintended pregnancy  and contraceptive alternatives.   Advised to avoid cigarette smoking.  I discussed with the patient that most  people either abstain from alcohol or drink within safe limits (<=14/week and <=4 drinks/occasion for males, <=7/weeks and <= 3 drinks/occasion for females) and that the risk for alcohol disorders and other health effects rises proportionally with the number of drinks per week and how often a drinker exceeds daily limits.  Discussed cessation/primary prevention of drug use and availability of treatment for abuse.   Diet: Encouraged to adjust caloric intake to maintain  or achieve ideal body weight, to reduce intake of dietary saturated fat and total fat, to limit sodium intake by avoiding high sodium foods and not adding table salt, and to maintain adequate dietary potassium and calcium preferably from fresh fruits, vegetables, and low-fat dairy products.    Stressed the importance of regular exercise  Injury prevention: Discussed safety belts, safety helmets, smoke detector, smoking near bedding or upholstery.   Dental health: Discussed importance of  regular tooth brushing, flossing, and dental visits.   Follow up plan: NEXT PREVENTATIVE PHYSICAL DUE IN 1 YEAR. Return in about 1 year (around 04/11/2023) for Annual exam -- Medicare.

## 2022-04-11 ENCOUNTER — Other Ambulatory Visit: Payer: Self-pay | Admitting: Nurse Practitioner

## 2022-04-11 DIAGNOSIS — E039 Hypothyroidism, unspecified: Secondary | ICD-10-CM

## 2022-04-11 LAB — CBC WITH DIFFERENTIAL/PLATELET
Basophils Absolute: 0 10*3/uL (ref 0.0–0.2)
Basos: 1 %
EOS (ABSOLUTE): 0.2 10*3/uL (ref 0.0–0.4)
Eos: 3 %
Hematocrit: 41.1 % (ref 37.5–51.0)
Hemoglobin: 12.7 g/dL — ABNORMAL LOW (ref 13.0–17.7)
Immature Grans (Abs): 0 10*3/uL (ref 0.0–0.1)
Immature Granulocytes: 0 %
Lymphocytes Absolute: 1.1 10*3/uL (ref 0.7–3.1)
Lymphs: 24 %
MCH: 26.7 pg (ref 26.6–33.0)
MCHC: 30.9 g/dL — ABNORMAL LOW (ref 31.5–35.7)
MCV: 87 fL (ref 79–97)
Monocytes Absolute: 0.4 10*3/uL (ref 0.1–0.9)
Monocytes: 10 %
Neutrophils Absolute: 2.9 10*3/uL (ref 1.4–7.0)
Neutrophils: 62 %
Platelets: 212 10*3/uL (ref 150–450)
RBC: 4.75 x10E6/uL (ref 4.14–5.80)
RDW: 16 % — ABNORMAL HIGH (ref 11.6–15.4)
WBC: 4.6 10*3/uL (ref 3.4–10.8)

## 2022-04-11 LAB — COMPREHENSIVE METABOLIC PANEL
ALT: 19 IU/L (ref 0–44)
AST: 22 IU/L (ref 0–40)
Albumin/Globulin Ratio: 1.4 (ref 1.2–2.2)
Albumin: 4.3 g/dL (ref 3.8–4.8)
Alkaline Phosphatase: 66 IU/L (ref 44–121)
BUN/Creatinine Ratio: 20 (ref 10–24)
BUN: 21 mg/dL (ref 8–27)
Bilirubin Total: 1.1 mg/dL (ref 0.0–1.2)
CO2: 25 mmol/L (ref 20–29)
Calcium: 9.6 mg/dL (ref 8.6–10.2)
Chloride: 103 mmol/L (ref 96–106)
Creatinine, Ser: 1.07 mg/dL (ref 0.76–1.27)
Globulin, Total: 3 g/dL (ref 1.5–4.5)
Glucose: 95 mg/dL (ref 70–99)
Potassium: 4.1 mmol/L (ref 3.5–5.2)
Sodium: 139 mmol/L (ref 134–144)
Total Protein: 7.3 g/dL (ref 6.0–8.5)
eGFR: 74 mL/min/{1.73_m2} (ref >59)

## 2022-04-11 LAB — LIPID PANEL W/O CHOL/HDL RATIO
Cholesterol, Total: 140 mg/dL (ref 100–199)
HDL: 46 mg/dL (ref >39)
LDL Chol Calc (NIH): 81 mg/dL (ref 0–99)
Triglycerides: 60 mg/dL (ref 0–149)
VLDL Cholesterol Cal: 13 mg/dL (ref 5–40)

## 2022-04-11 LAB — PSA: Prostate Specific Ag, Serum: 0.2 ng/mL (ref 0.0–4.0)

## 2022-04-11 LAB — TSH: TSH: 5.32 u[IU]/mL — ABNORMAL HIGH (ref 0.450–4.500)

## 2022-04-11 NOTE — Progress Notes (Signed)
Contacted via Richard Wolfe -- needs lab only visit in 3 weeks please  Good morning Ted, your labs have returned: - CBC continues to show some mild reduction in hemoglobin, but normal hematocrit.  This may be related to all your recent surgeries and is mild.  I would recommend increase deep leafy greens and red meat to get some extra iron in diet. - Kidney function, creatinine and eGFR, remains normal, as is liver function, AST and ALT.   - Cholesterol labs are stable.  Prostate lab is normal. - TSH, thyroid lab, is elevated again.  Please return in 3 weeks for lab only visit to recheck this.  It is showing more sluggish or hypothyroid level.  Any questions? Keep being stellar!!  Thank you for allowing me to participate in your care.  I appreciate you. Kindest regards, Elin Fenley

## 2022-04-14 ENCOUNTER — Other Ambulatory Visit: Payer: Self-pay | Admitting: Pulmonary Disease

## 2022-04-25 ENCOUNTER — Encounter: Payer: Self-pay | Admitting: Pulmonary Disease

## 2022-04-25 ENCOUNTER — Ambulatory Visit (INDEPENDENT_AMBULATORY_CARE_PROVIDER_SITE_OTHER): Payer: Medicare Other | Admitting: Pulmonary Disease

## 2022-04-25 VITALS — BP 150/80 | HR 83 | Temp 98.2°F | Ht 73.0 in | Wt 199.6 lb

## 2022-04-25 DIAGNOSIS — J4530 Mild persistent asthma, uncomplicated: Secondary | ICD-10-CM | POA: Diagnosis not present

## 2022-04-25 MED ORDER — BUDESONIDE-FORMOTEROL FUMARATE 80-4.5 MCG/ACT IN AERO: 2.0000 | INHALATION_SPRAY | RESPIRATORY_TRACT | 12 refills | Status: DC | PRN

## 2022-04-25 NOTE — Progress Notes (Signed)
[facility]  Chief Complaint  Patient presents with   Follow-up    Past Surgical History:  He  has a past surgical history that includes Wisdom tooth extraction (1967); Nasal polyp excision; Nasal septum surgery; Inguinal hernia repair (Left); Trigeminal nerve decompression (2002, 2003); trigeminal nerve block (2003); Refractive surgery (2008); Cataract extraction w/ intraocular lens implant (Left, 10/19/2008); Cataract extraction w/ intraocular lens implant (Right, 11/09/2008); Inguinal hernia repair (Right); XI Robotic assisted inguinal hernia repair with mesh (Left, 2019); Eye surgery; ENDOVASCULAR REPAIR/STENT GRAFT (N/A, 12/08/2019); Knee surgery (Left); Total knee arthroplasty (Right, 03/06/2021); and Total knee arthroplasty (Left, 09/28/2020).  Past Medical History:  Valvular heart disease, Ascending thoracic aortic aneurysm, Nasal polyps, OSA  Constitutional:  BP (!) 150/80 (BP Location: Right Arm, Patient Position: Sitting, Cuff Size: Large)   Pulse 83   Temp 98.2 F (36.8 C) (Oral)   Ht 6\' 1"  (1.854 m)   Wt 199 lb 9.6 oz (90.5 kg)   SpO2 95%   BMI 26.33 kg/m   Brief Summary:  Richard Wolfe is a 71 y.o. male with asthma.      Subjective:   He has noticed more trouble with cough and dyspnea with certain activities - working outside, playing pickle ball.  Otherwise he feels like he is doing well.  He is an 60 and is working 40 hours per week.  He stays physical active at home.  No issues with his sinus, and uses nasal budesonide rinse daily.  Sleeping okay.    Physical Exam:   Appearance - well kempt   ENMT - no sinus tenderness, no oral exudate, no LAN, Mallampati 3 airway, no stridor  Respiratory - equal breath sounds bilaterally, no wheezing or rales  CV - s1s2 regular rate and rhythm, no murmurs  Ext - no clubbing, no edema  Skin - no rashes  Psych - normal mood and affect    Pulmonary testing:    Chest Imaging:  CT angio chest 10/12/19 >> ascending thoracic aorta 4.2 cm, atelectasis  Cardiac Tests:  Echo 02/16/21 >> EF 45 to 50%, RVSP 35 mmHg, mod/severe MR, mild/mod AR, aortic root 43 mm  Social History:  He  reports that he has never smoked. He has never used smokeless tobacco. He reports current alcohol use of about 2.0 standard drinks of alcohol per week. He reports that he does not use drugs.  Family History:  His family history includes Arthritis in his brother; Cancer in his brother, father, and paternal grandfather; Colon cancer in his paternal aunt and paternal uncle; Heart disease in his half-brother; Heart failure in his half-brother; Rheum arthritis in his mother.     Assessment/Plan:   Mild, intermittent asthma. - he can try using symbicort 80 two puffs prn - discussed when to use his symbicort  Obstructive sleep apnea. - no longer using CPAP  Nasal polyposis. - controlled with daily budesonide rinse  Valvular heart disease. - followed by Dr. 02/18/21 End with cardiology  Time Spent Involved in Patient Care on Day of Examination:  27 minutes  Follow up:   Patient Instructions  Follow up in 1 year  Medication List:   Allergies as of 04/25/2022       Reactions   Sulfites Swelling   Swelling of tongue and throat after eating lettuce, was told likely r/t sulfites on produce at the time   Penicillins    Convulsions Did it involve swelling of the face/tongue/throat, SOB, or  low BP? No Did it involve sudden or severe rash/hives, skin peeling, or any reaction on the inside of your mouth or nose? No Did you need to seek medical attention at a hospital or doctor's office? Yes When did it last happen?       If all above answers are "NO", may proceed with cephalosporin use.        Medication List        Accurate as of April 25, 2022 12:59 PM. If you have any questions, ask your nurse or doctor.          STOP taking  these medications    albuterol 108 (90 Base) MCG/ACT inhaler Commonly known as: VENTOLIN HFA Stopped by: Coralyn Helling, MD       TAKE these medications    ASCORBIC ACID PO Take 1,000 mg by mouth daily. With echinacea   budesonide 0.5 MG/2ML nebulizer solution Commonly known as: PULMICORT Take 2 mLs (0.5 mg total) by nebulization 2 (two) times daily.   budesonide-formoterol 80-4.5 MCG/ACT inhaler Commonly known as: SYMBICORT Inhale 2 puffs into the lungs as needed (cough, wheeze, chest congestion, or shortness of breath). What changed: See the new instructions. Changed by: Coralyn Helling, MD   CURCUMIN 95 PO Take 500 mg by mouth daily.   Cyanocobalamin 5000 MCG Subl Place 1 tablet under the tongue daily.   doxycycline 20 MG tablet Commonly known as: PERIOSTAT Take 20 mg by mouth as needed.   FOLATE PO Take 400 mcg by mouth daily.   Magnesium 400 MG Caps Take 800 mg by mouth daily.   milk thistle 175 MG tablet Take 350 mg by mouth daily.   OMEGA-3 COMPLEX PO Take 1,280 mg by mouth daily.   Ubiquinol 100 MG Caps Take 1 capsule by mouth daily.   Vitamin D3 125 MCG (5000 UT) Caps Take 5,000 Units by mouth daily.        Signature:  Coralyn Helling, MD [facility] Pager - [phone] 04/25/2022, 12:59 PM

## 2022-04-25 NOTE — Patient Instructions (Signed)
Follow up in 1 year.

## 2022-05-02 ENCOUNTER — Telehealth: Payer: Self-pay

## 2022-05-02 NOTE — Telephone Encounter (Signed)
Copied from CRM [id number]. Topic: General - Other >> May 02, 2022  3:57 PM Clide Dales wrote: Patient would like to talk to pcp about the labs he is coming in for on 9/12. Please follow up with patient.

## 2022-05-03 ENCOUNTER — Other Ambulatory Visit: Payer: Self-pay | Admitting: Nurse Practitioner

## 2022-05-03 ENCOUNTER — Encounter: Payer: Self-pay | Admitting: Nurse Practitioner

## 2022-05-03 DIAGNOSIS — E039 Hypothyroidism, unspecified: Secondary | ICD-10-CM

## 2022-05-03 DIAGNOSIS — R7989 Other specified abnormal findings of blood chemistry: Secondary | ICD-10-CM

## 2022-05-03 NOTE — Telephone Encounter (Signed)
Spoke with patient and made him aware of [name]'s recommendations. Patient says he is not concerned about "why" he is having the labs. Patient says he is well aware. Patient says he sent [name] a MyChart message last week and he would like for [name] to respond personally. I informed patient that there may have been an error with MyChart system and that the message did not come through. Patient was advised to send the message again when he has a moment. 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 Patient verbalized understanding.

## 2022-05-07 ENCOUNTER — Other Ambulatory Visit: Payer: Medicare Other

## 2022-05-07 ENCOUNTER — Ambulatory Visit (INDEPENDENT_AMBULATORY_CARE_PROVIDER_SITE_OTHER): Payer: Medicare Other

## 2022-05-07 VITALS — BP 131/64 | HR 72 | Temp 98.1°F | Ht 73.0 in | Wt 200.1 lb

## 2022-05-07 DIAGNOSIS — R7989 Other specified abnormal findings of blood chemistry: Secondary | ICD-10-CM

## 2022-05-07 DIAGNOSIS — E039 Hypothyroidism, unspecified: Secondary | ICD-10-CM

## 2022-05-07 DIAGNOSIS — Z0000 Encounter for general adult medical examination without abnormal findings: Secondary | ICD-10-CM | POA: Diagnosis not present

## 2022-05-07 NOTE — Progress Notes (Signed)
Subjective:   Richard Wolfe is a 71 y.o. male who presents for Medicare Annual/Subsequent preventive examination.  Review of Systems    Defer to PCP.        Objective:    Today's Vitals   05/07/22 1102 05/07/22 1108 05/07/22 1134  BP: (!) 151/60  131/64  Pulse: 75  72  Temp: 98.1 F (36.7 C)    TempSrc: Oral    SpO2: 95%    Weight: 200 lb 1.6 oz (90.8 kg)    Height: 6\' 1"  (1.854 m)    PainSc: 1  1     Body mass index is 26.4 kg/m.     12/08/2019    6:48 AM 11/25/2019    2:06 PM  Advanced Directives  Does Patient Have a Medical Advance Directive?  Yes  Type of Estate agent of [employer] Power of [place];Living will  Copy of Healthcare Power of Attorney in Chart? No - copy requested No - copy requested    Current Medications (verified) Outpatient Encounter Medications as of 05/07/2022  Medication Sig   ASCORBIC ACID PO Take 1,000 mg by mouth daily. With echinacea   budesonide (PULMICORT) 0.5 MG/2ML nebulizer solution Take 2 mLs (0.5 mg total) by nebulization 2 (two) times daily.   budesonide-formoterol (SYMBICORT) 80-4.5 MCG/ACT inhaler Inhale 2 puffs into the lungs as needed (cough, wheeze, chest congestion, or shortness of breath).   Cholecalciferol (VITAMIN D3) 125 MCG (5000 UT) CAPS Take 5,000 Units by mouth daily.    Cyanocobalamin 5000 MCG SUBL Place 1 tablet under the tongue daily.   DHA-EPA-Vitamin E (OMEGA-3 COMPLEX PO) Take 1,280 mg by mouth daily.   doxycycline (PERIOSTAT) 20 MG tablet Take 20 mg by mouth as needed.   Folic Acid (FOLATE PO) Take 400 mcg by mouth daily.   Magnesium 400 MG CAPS Take 800 mg by mouth daily.    milk thistle 175 MG tablet Take 350 mg by mouth daily.   Turmeric (CURCUMIN 95 PO) Take 500 mg by mouth daily.   Ubiquinol 100 MG CAPS Take 1 capsule by mouth daily.   No facility-administered encounter medications on file as of 05/07/2022.    Allergies (verified) Sulfites and Penicillins    History: Past Medical History:  Diagnosis Date   Aortic regurgitation    Arthritis    Asthma    Hernia, inguinal, recurrent    Nasal polyps    Pneumonia    PONV (postoperative nausea and vomiting)    Past Surgical History:  Procedure Laterality Date   CATARACT EXTRACTION W/ INTRAOCULAR LENS IMPLANT Left 10/19/2008   CATARACT EXTRACTION W/ INTRAOCULAR LENS IMPLANT Right 11/09/2008   ENDOVASCULAR REPAIR/STENT GRAFT N/A 12/08/2019   Procedure: ENDOVASCULAR REPAIR/STENT GRAFT;  Surgeon: Renford Dills, MD;  Location: [facility] INVASIVE CV LAB;  Service: Cardiovascular;  Laterality: N/A;   EYE SURGERY     INGUINAL HERNIA REPAIR Left    1999   INGUINAL HERNIA REPAIR Right    2019   KNEE SURGERY Left    NASAL POLYP EXCISION     1981, 1986, 1993, 1994, 1995, 1996, 1998, 1999, 2001   NASAL SEPTUM SURGERY     1981   REFRACTIVE SURGERY  2008   TOTAL KNEE ARTHROPLASTY Right 03/06/2021   TOTAL KNEE ARTHROPLASTY Left 09/28/2020   trigeminal nerve block  2003   TRIGEMINAL NERVE DECOMPRESSION  2002, 2003   WISDOM TOOTH EXTRACTION  1967   XI ROBOTIC ASSISTED INGUINAL HERNIA REPAIR WITH MESH Left 2019  Family History  Problem Relation Age of Onset   Rheum arthritis Mother    Cancer Father        liver   Arthritis Brother    Cancer Paternal Grandfather        throat   Cancer Brother        liver   Heart disease Half-Brother        Pacemaker/ICD   Heart failure Half-Brother    Colon cancer Paternal Aunt    Colon cancer Paternal Uncle    Social History   Socioeconomic History   Marital status: Married    Spouse name: Not on file   Number of children: Not on file   Years of education: Not on file   Highest education level: Not on file  Occupational History   Not on file  Tobacco Use   Smoking status: Never   Smokeless tobacco: Never  Vaping Use   Vaping Use: Never used  Substance and Sexual Activity   Alcohol use: Yes    Alcohol/week: 2.0 standard drinks of alcohol     Types: 1 Glasses of wine, 1 Cans of beer per week    Comment: socially with dinner   Drug use: Never   Sexual activity: Yes  Other Topics Concern   Not on file  Social History Narrative   Not on file   Social Determinants of Health   Financial Resource Strain: Low Risk  (05/07/2022)   Overall Financial Resource Strain (CARDIA)    Difficulty of Paying Living Expenses: Not hard at all  Food Insecurity: No Food Insecurity (05/07/2022)   Hunger Vital Sign    Worried About Running Out of Food in the Last Year: Never true    Ran Out of Food in the Last Year: Never true  Transportation Needs: No Transportation Needs (05/07/2022)   PRAPARE - Administrator, Civil Service (Medical): No    Lack of Transportation (Non-Medical): No  Physical Activity: Insufficiently Active (05/07/2022)   Exercise Vital Sign    Days of Exercise per Week: 2 days    Minutes of Exercise per Session: 30 min  Stress: No Stress Concern Present (05/07/2022)   [employer] of Occupational Health - Occupational Stress Questionnaire    Feeling of Stress : Not at all  Social Connections: Moderately Integrated (05/07/2022)   Social Connection and Isolation Panel [NHANES]    Frequency of Communication with Friends and Family: More than three times a week    Frequency of Social Gatherings with Friends and Family: Once a week    Attends Religious Services: More than 4 times per year    Active Member of [employer] or Organizations: No    Attends Engineer, structural: Never    Marital Status: Married    Tobacco Counseling Counseling given: Not Answered   Clinical Intake:  Pre-visit preparation completed: Yes  Pain : 0-10 Pain Score: 1  Pain Type: Acute pain Pain Location: Neck Pain Onset: 1 to 4 weeks ago Pain Frequency: Occasional     Nutritional Risks: None Diabetes: No  How often do you need to have someone help you when you read instructions, pamphlets, or other written materials  from your doctor or pharmacy?: 1 - Never What is the last grade level you completed in school?: 1 year into Master's degree, has Bachelor's degree in criminal justice  Diabetic- No  Interpreter Needed?: No    Activities of Daily Living    04/10/2022    9:34 AM  In your present state of health, do you have any difficulty performing the following activities:  Hearing? 0  Vision? 0  Difficulty concentrating or making decisions? 0  Walking or climbing stairs? 0  Dressing or bathing? 0  Doing errands, shopping? 0    Patient Care Team: Venita Lick, NP as PCP - General (Nurse Practitioner) Algernon Huxley, MD as Referring Physician (Vascular Surgery)  Indicate any recent Medical Services you may have received from other than [facility] providers in the past year (date may be approximate).     Assessment:   This is a routine wellness examination for Rhylen.  Hearing/Vision screen No results found.  Dietary issues and exercise activities discussed:     Goals Addressed   None   Depression Screen    05/07/2022   11:15 AM 04/10/2022    9:25 AM 01/25/2021   10:00 AM 08/09/2019    1:33 PM  PHQ 2/9 Scores  PHQ - 2 Score 0 0 0 0  PHQ- 9 Score 0 0      Fall Risk    04/10/2022    9:25 AM 01/25/2021    9:59 AM 10/13/2019    2:04 PM 09/29/2019    3:44 PM 08/09/2019    1:33 PM  [place] in the past year? 0 0 0 0 0  Number falls in past yr: 0 0   0  Injury with Fall? 0 0   0  Risk for fall due to : No Fall Risks Other (Comment)     Follow up Falls evaluation completed Falls evaluation completed   Falls evaluation completed    [place]:  Any stairs in or around the home? Yes  If so, are there any without handrails? Yes  Home free of loose throw rugs in walkways, pet beds, electrical cords, etc? No  Adequate lighting in your home to reduce risk of falls? Yes   ASSISTIVE DEVICES UTILIZED TO PREVENT FALLS:  Life alert? No  Use of a  cane, walker or w/c? No  Grab bars in the bathroom? No  Shower chair or bench in shower? Yes  Elevated toilet seat or a handicapped toilet? Yes   TIMED UP AND GO:  Was the test performed? No .   Cognitive Function:        04/10/2022   10:00 AM 01/03/2020    2:45 PM  6CIT Screen  What Year? 0 points 0 points  What month? 0 points 0 points  What time? 0 points 0 points  Count back from 20 0 points 0 points  Months in reverse 0 points 0 points  Repeat phrase 0 points 8 points  Total Score 0 points 8 points    Immunizations Immunization History  Administered Date(s) Administered   Pneumococcal Conjugate-13 08/27/2015   Pneumococcal Polysaccharide-23 07/26/2008, 08/26/2016    TDAP status: Due, Education has been provided regarding the importance of this vaccine. Advised may receive this vaccine at local pharmacy or Health Dept. Aware to provide a copy of the vaccination record if obtained from local pharmacy or Health Dept. Verbalized acceptance and understanding.  Flu Vaccine status: Declined, Education has been provided regarding the importance of this vaccine but patient still declined. Advised may receive this vaccine at local pharmacy or Health Dept. Aware to provide a copy of the vaccination record if obtained from local pharmacy or Health Dept. Verbalized acceptance and understanding.  Pneumococcal vaccine status: Up to date  Covid-19 vaccine status: Declined, Education has been provided regarding the importance of this vaccine but patient still declined. Advised may receive this vaccine at local pharmacy or Health Dept.or vaccine clinic. Aware to provide a copy of the vaccination record if obtained from local pharmacy or Health Dept. Verbalized acceptance and understanding.  Qualifies for Shingles Vaccine? Yes   Zostavax completed No   Shingrix Completed?: No.    Education has been provided regarding the importance of this vaccine. Patient has been advised to call  insurance company to determine out of pocket expense if they have not yet received this vaccine. Advised may also receive vaccine at local pharmacy or Health Dept. Verbalized acceptance and understanding.  Screening Tests Health Maintenance  Topic Date Due   COVID-19 Vaccine (1) 05/23/2022 (Originally 03/10/1951)   Zoster Vaccines- Shingrix (1 of 2) 07/11/2022 (Originally 09/11/1999)   INFLUENZA VACCINE  11/24/2022 (Originally 03/26/2022)   COLONOSCOPY (Pts 45-64yrs Insurance coverage will need to be confirmed)  04/11/2023 (Originally 09/10/1994)   TETANUS/TDAP  04/11/2023 (Originally [birth year]10/1968)   Pneumonia Vaccine 5+ Years old  Completed   Hepatitis C Screening  Completed   HPV VACCINES  Aged Out    Health Maintenance  There are no preventive care reminders to display for this patient.   Colonoscopy- patient refused  Lung Cancer Screening: (Low Dose CT Chest recommended if Age 78-80 years, 30 pack-year currently smoking OR have quit w/in 15years.) does not qualify.   Lung Cancer Screening Referral: N/A  Additional Screening:  Hepatitis C Screening: does qualify; Completed 01/03/2020  Vision Screening: Recommended annual ophthalmology exams for early detection of glaucoma and other disorders of the eye. Is the patient up to date with their annual eye exam?  No  Who is the provider or what is the name of the office in which the patient attends annual eye exams? N/A If pt is not established with a provider, would they like to be referred to a provider to establish care? No .   Dental Screening: Recommended annual dental exams for proper oral hygiene  Community Resource Referral / Chronic Care Management: CRR required this visit?  No   CCM required this visit?  No      Plan:     I have personally reviewed and noted the following in the patient's chart:   Medical and social history Use of alcohol, tobacco or illicit drugs  Current medications and supplements including opioid  prescriptions. Patient is not currently taking opioid prescriptions. Functional ability and status Nutritional status Physical activity Advanced directives List of other physicians Hospitalizations, surgeries, and ER visits in previous 12 months Vitals Screenings to include cognitive, depression, and falls Referrals and appointments  In addition, I have reviewed and discussed with patient certain preventive protocols, quality metrics, and best practice recommendations. A written personalized care plan for preventive services as well as general preventive health recommendations were provided to patient.     Pablo Ledger, [place]   05/07/2022   Nurse Notes: Face to face 45 minutes   Mr. Stickels ,  Thank you for taking time to come for your Medicare Wellness Visit. I appreciate your ongoing commitment to your health goals. Please review the following plan we discussed and let me know if I can assist you in the future.   These are the goals we discussed:  Goals   None     This is a list of the screening recommended for you and due dates:  Health Maintenance  Topic Date  Due   COVID-19 Vaccine (1) 05/23/2022*   Zoster (Shingles) Vaccine (1 of 2) 07/11/2022*   Flu Shot  11/24/2022*   Colon Cancer Screening  04/11/2023*   Tetanus Vaccine  04/11/2023*   Pneumonia Vaccine  Completed   Hepatitis C Screening: USPSTF Recommendation to screen - Ages 18-79 yo.  Completed   HPV Vaccine  Aged Out  *Topic was postponed. The date shown is not the original due date.

## 2022-05-08 ENCOUNTER — Other Ambulatory Visit: Payer: Self-pay | Admitting: Nurse Practitioner

## 2022-05-08 ENCOUNTER — Encounter: Payer: Self-pay | Admitting: Nurse Practitioner

## 2022-05-08 DIAGNOSIS — E063 Autoimmune thyroiditis: Secondary | ICD-10-CM

## 2022-05-08 LAB — T3, FREE: T3, Free: 2.8 pg/mL (ref 2.0–4.4)

## 2022-05-08 LAB — TSH: TSH: 7.81 u[IU]/mL — ABNORMAL HIGH (ref 0.450–4.500)

## 2022-05-08 LAB — T4, FREE: Free T4: 0.98 ng/dL (ref 0.82–1.77)

## 2022-05-08 LAB — THYROGLOBULIN ANTIBODY: Thyroglobulin Antibody: 73.2 IU/mL — ABNORMAL HIGH (ref 0.0–0.9)

## 2022-05-08 LAB — THYROID PEROXIDASE ANTIBODY: Thyroperoxidase Ab SerPl-aCnc: >600 IU/mL — ABNORMAL HIGH (ref 0–34)

## 2022-05-08 MED ORDER — LEVOTHYROXINE SODIUM 25 MCG PO TABS: 25.0000 ug | ORAL_TABLET | Freq: Every day | ORAL | 3 refills | Status: DC

## 2022-05-08 NOTE — Progress Notes (Signed)
Contacted via MyChart -- needs follow-up in 6 weeks please   Good afternoon Richard Wolfe, your thyroid labs have returned and definitely looks like Hashimoto's.  I would recommend we start Levothyroxine 25 MCG daily and will send this in.  Also if interested would recommend a thyroid ultrasound to assess thyroid further.  Any questions? I will have staff reach out to schedule a follow-up I 6 weeks to recheck TSH and Free T4.   Keep being amazing!!  Thank you for allowing me to participate in your care.  I appreciate you. Kindest regards, Joh Rao

## 2022-05-09 DIAGNOSIS — E063 Autoimmune thyroiditis: Secondary | ICD-10-CM | POA: Insufficient documentation

## 2022-05-13 ENCOUNTER — Ambulatory Visit
Admission: RE | Admit: 2022-05-13 | Discharge: 2022-05-13 | Disposition: A | Payer: Medicare Other | Source: Ambulatory Visit | Attending: Nurse Practitioner | Admitting: Nurse Practitioner

## 2022-05-14 DIAGNOSIS — M542 Cervicalgia: Secondary | ICD-10-CM | POA: Insufficient documentation

## 2022-06-24 ENCOUNTER — Encounter (INDEPENDENT_AMBULATORY_CARE_PROVIDER_SITE_OTHER): Payer: Self-pay

## 2022-07-16 ENCOUNTER — Other Ambulatory Visit (INDEPENDENT_AMBULATORY_CARE_PROVIDER_SITE_OTHER): Payer: Medicare Other

## 2022-07-16 ENCOUNTER — Ambulatory Visit (INDEPENDENT_AMBULATORY_CARE_PROVIDER_SITE_OTHER): Payer: Medicare Other | Admitting: Vascular Surgery

## 2022-09-03 ENCOUNTER — Telehealth: Payer: Self-pay | Admitting: Pulmonary Disease

## 2022-09-03 NOTE — Telephone Encounter (Signed)
Can you please have pharmacy team determine what is covered by his insurance plan and then let me know.

## 2022-09-03 NOTE — Telephone Encounter (Signed)
Received a call from pt who was told by his pharmacy that both generic and brand name Symbicort are no longer covered by pt's insurance.  Pt needs to have an alternative therapy prescribed. Dr. Halford Chessman, please advise on this for pt.

## 2022-09-04 NOTE — Telephone Encounter (Signed)
Can you please run a ticket to see what patients insurance will cover

## 2022-09-05 ENCOUNTER — Other Ambulatory Visit (HOSPITAL_COMMUNITY): Payer: Self-pay

## 2022-09-05 NOTE — Telephone Encounter (Signed)
Per benefits investigation Breo, Dulera and Advair HFA; however they all have high co-pays due to the patients deductible to meet at the beginning of the new year.

## 2022-09-06 ENCOUNTER — Other Ambulatory Visit (HOSPITAL_COMMUNITY): Payer: Self-pay

## 2022-09-06 NOTE — Telephone Encounter (Signed)
Called patient but he did not answer. Left message for patient to call back.

## 2022-09-06 NOTE — Telephone Encounter (Signed)
Please send a script for dulera 100 two puffs prn for cough, wheeze, shortness of breath or chest congestion.

## 2022-09-09 MED ORDER — MOMETASONE FURO-FORMOTEROL FUM 100-5 MCG/ACT IN AERO: 2.0000 | INHALATION_SPRAY | RESPIRATORY_TRACT | 11 refills | Status: DC | PRN

## 2022-09-09 NOTE — Telephone Encounter (Signed)
Patient is returning phone call. Patient phone number is [phone].

## 2022-09-09 NOTE — Telephone Encounter (Signed)
Spoke with pt and let him know that Dr. Halford Chessman had chosen Dulera 100 for pt. Pt stated understanding and Dulera order was placed. Nothing further needed at this time.

## 2022-09-11 ENCOUNTER — Telehealth: Payer: Self-pay | Admitting: Pulmonary Disease

## 2022-09-11 MED ORDER — BUDESONIDE-FORMOTEROL FUMARATE 80-4.5 MCG/ACT IN AERO: 2.0000 | INHALATION_SPRAY | RESPIRATORY_TRACT | 12 refills | Status: DC | PRN

## 2022-09-11 NOTE — Telephone Encounter (Signed)
Per provider note symbicort sent to pharmacy on file

## 2022-11-02 NOTE — Patient Instructions (Signed)
Cervical Radiculopathy  Cervical radiculopathy happens when a nerve in the neck (a cervical nerve) is pinched or bruised. This condition can happen because of an injury to the cervical spine (vertebrae) in the neck, or as part of the normal aging process. Pressure on the cervical nerves can cause pain or numbness that travels from the neck all the way down to the arm and fingers. This condition usually gets better with rest. Treatment may be needed if the condition does not improve. What are the causes? This condition may be caused by: A neck injury. A bulging (herniated) disk. Muscle spasms. Muscle tightness in the neck due to overuse. Arthritis. Breakdown or degeneration in the bones and joints of the spine (spondylosis) due to aging. Bone spurs that may develop near the cervical nerves. What are the signs or symptoms? Symptoms of this condition include: Pain. The pain may travel from the neck to the arm and hand. The pain can be severe or irritating. It may get worse when you move your neck. Numbness or tingling in your arm or hand. Weakness in the affected arm and hand, in severe cases. How is this diagnosed? This condition may be diagnosed based on your symptoms, your medical history, and a physical exam. You may also have tests, including: X-rays. CT scan. MRI. Electromyogram (EMG). Nerve conduction tests. How is this treated? In many cases, treatment is not needed for this condition. With rest, the condition usually gets better over time. If treatment is needed, options may include: Wearing a soft neck collar (cervical collar) for short periods of time. Doing physical therapy to strengthen your neck muscles. Taking medicines. These may include NSAIDs, such as ibuprofen, or oral corticosteroids. Having spinal injections, in severe cases. Having surgery. This may be needed if other treatments do not help. Different types of surgery may be done depending on the cause of this  condition. Follow these instructions at home: If you have a cervical collar: Wear it as told by your health care provider. Remove it only as told by your health care provider. Ask your health care provider if you can remove the cervical collar for cleaning and bathing. If you are allowed to remove the collar for cleaning or bathing: Follow instructions from your health care provider about how to remove the collar safely. Clean the collar by wiping it with mild soap and water and drying it completely. Take out any removable pads in the collar every 1-2 days, and wash them by hand with soap and water. Let them air-dry completely before you put them back in the collar. Check your skin under the collar for irritation or sores. If you see any, tell your health care provider. Managing pain     Take over-the-counter and prescription medicines only as told by your health care provider. If directed, put ice on the affected area. To do this: If you have a soft neck collar, remove it as told by your health care provider. Put ice in a plastic bag. Place a towel between your skin and the bag. Leave the ice on for 20 minutes, 2-3 times a day. Remove the ice if your skin turns bright red. This is very important. If you cannot feel pain, heat, or cold, you have a greater risk of damage to the area. If applying ice does not help, you can try using heat. Use the heat source that your health care provider recommends, such as a moist heat pack or a heating pad. Place a towel between   your skin and the heat source. Leave the heat on for 20-30 minutes. Remove the heat if your skin turns bright red. This is especially important if you are unable to feel pain, heat, or cold. You have a greater risk of getting burned. Try a gentle neck and shoulder massage to help relieve symptoms. Activity Rest as needed. Return to your normal activities as told by your health care provider. Ask your health care provider what  activities are safe for you. Do stretching and strengthening exercises as told by your health care provider or your physical therapist. You may have to avoid lifting. Ask your health care provider how much you can safely lift. General instructions Use a flat pillow when you sleep. Do not drive while wearing a cervical collar. If you do not have a cervical collar, ask your health care provider if it is safe to drive while your neck heals. Ask your health care provider if the medicine prescribed to you requires you to avoid driving or using machinery. Do not use any products that contain nicotine or tobacco. These products include cigarettes, chewing tobacco, and vaping devices, such as e-cigarettes. If you need help quitting, ask your health care provider. Keep all follow-up visits. This is important. Contact a health care provider if: Your condition does not improve with treatment. Get help right away if: Your pain gets much worse and is not controlled with medicines. You have weakness or numbness in your hand, arm, face, or leg. You have a high fever. You have a stiff, rigid neck. You lose control of your bowels or your bladder (have incontinence). You have trouble with walking, balance, or speaking. Summary Cervical radiculopathy happens when a nerve in the neck is pinched or bruised. A nerve can get pinched from a bulging disk, arthritis, muscle spasms, or an injury to the neck. Symptoms include pain, tingling, or numbness radiating from the neck to the arm or hand. Weakness can also occur in severe cases. Treatment may include rest, wearing a cervical collar, and physical therapy. Medicines may be prescribed to help with pain. In severe cases, injections or surgery may be needed. This information is not intended to replace advice given to you by your health care provider. Make sure you discuss any questions you have with your health care provider. Document Revised: 02/15/2021 Document  Reviewed: 02/15/2021 Elsevier Patient Education  2023 [employer]vier Inc.  

## 2022-11-05 ENCOUNTER — Ambulatory Visit (INDEPENDENT_AMBULATORY_CARE_PROVIDER_SITE_OTHER): Payer: Medicare Other | Admitting: Nurse Practitioner

## 2022-11-05 ENCOUNTER — Encounter: Payer: Self-pay | Admitting: Nurse Practitioner

## 2022-11-05 VITALS — BP 142/75 | HR 83 | Temp 98.2°F | Ht 72.99 in | Wt 194.4 lb

## 2022-11-05 DIAGNOSIS — E063 Autoimmune thyroiditis: Secondary | ICD-10-CM

## 2022-11-05 DIAGNOSIS — M542 Cervicalgia: Secondary | ICD-10-CM

## 2022-11-05 NOTE — Progress Notes (Signed)
BP (!) 142/75   Pulse 83   Temp 98.2 F (36.8 C) (Oral)   Ht 6' 0.99" (1.854 m)   Wt 194 lb 6.4 oz (88.2 kg)   SpO2 94%   BMI 25.65 kg/m    Subjective:    Patient ID: Richard Wolfe, male    DOB: 11-Dec-1950, 72 y.o.   MRN: [id number]  HPI: Richard Wolfe is a 72 y.o. male  Chief Complaint  Patient presents with   Neck Pain    Still having pain from 7 months ago from playing pickle ball   NECK PAIN  Started 7 months ago while playing pickle ball. Discomfort from lower neck, midway up skull pain presents R>L,  Lower back pain is across lower mainly to left side. Does not like to take medication.  Has had massage therapy, chiropractor (no benefit).  Went to Dr Retta Mac with physiatry at [facility] -- he ordered PT (went to [facility] for this) which initially helped for about 4-6 weeks and then pain returned.  Imaging was done with [facility] noting cervical lordosis and multilevel mild disc height loss in the mid cervical spine.  Lumbar:  Dextrocurvature, L5 compression deformity, multilevel mild disc height loss and facet arthropathy.   Status: uncontrolled Treatments attempted: massage, PT Compliant with recommended treatment: yes Relief with NSAIDs?:  No NSAIDs Taken Location:R>L and midline Duration:months Severity: 4/10 at worst Quality: dull and aching Frequency: constant Radiation: none Aggravating factors: movement Alleviating factors: massage and PT Weakness:  no Paresthesias / decreased sensation:  no  Fevers:  no   HYPOTHYROIDISM Started on Levothyroxine 25 MCG September 2023. Thyroid control status:stable Satisfied with current treatment? yes Medication side effects: no Medication compliance: good compliance Etiology of hypothyroidism: Hashimoto's Recent dose adjustment:no Fatigue: no Cold intolerance: no Heat intolerance: no Weight gain: no Weight loss: no Constipation: no Diarrhea/loose stools: no Palpitations: no Lower extremity edema:  no Anxiety/depressed mood: no     11/05/2022    3:18 PM 05/07/2022   11:15 AM 04/10/2022    9:25 AM 01/25/2021   10:00 AM 08/09/2019    1:33 PM  Depression screen PHQ 2/9  Decreased Interest 0 0 0 0 0  Down, Depressed, Hopeless 0 0 0 0 0  PHQ - 2 Score 0 0 0 0 0  Altered sleeping 0 0 0    Tired, decreased energy 0 0 0    Change in appetite 0 0 0    Feeling bad or failure about yourself  0 0 0    Trouble concentrating 0 0 0    Moving slowly or fidgety/restless 0 0 0    Suicidal thoughts 0 0 0    PHQ-9 Score 0 0 0    Difficult doing work/chores Not difficult at all Not difficult at all Not difficult at all         11/05/2022    3:18 PM 04/10/2022    9:25 AM  GAD 7 : Generalized Anxiety Score  Nervous, Anxious, on Edge 0 0  Control/stop worrying 0 0  Worry too much - different things 0 0  Trouble relaxing 0 0  Restless 0 0  Easily annoyed or irritable 0 0  Afraid - awful might happen 0 0  Total GAD 7 Score 0 0  Anxiety Difficulty Not difficult at all Not difficult at all     Relevant past medical, surgical, family and social history reviewed and updated as indicated. Interim medical history since our last visit reviewed. Allergies and  medications reviewed and updated.  Review of Systems  Constitutional:  Negative for activity change, diaphoresis, fatigue and fever.  Respiratory:  Negative for cough, chest tightness, shortness of breath and wheezing.   Cardiovascular:  Negative for chest pain, palpitations and leg swelling.  Endocrine: Negative for cold intolerance, heat intolerance, polydipsia, polyphagia and polyuria.  Musculoskeletal:  Positive for back pain and neck pain.  Neurological:  Negative for dizziness, syncope, weakness, light-headedness, numbness and headaches.  Psychiatric/Behavioral: Negative.      Per HPI unless specifically indicated above     Objective:    BP (!) 142/75   Pulse 83   Temp 98.2 F (36.8 C) (Oral)   Ht 6' 0.99" (1.854 m)   Wt 194 lb  6.4 oz (88.2 kg)   SpO2 94%   BMI 25.65 kg/m   Wt Readings from Last 3 Encounters:  11/05/22 194 lb 6.4 oz (88.2 kg)  05/07/22 200 lb 1.6 oz (90.8 kg)  04/25/22 199 lb 9.6 oz (90.5 kg)    Physical Exam Vitals and nursing note reviewed.  Constitutional:      General: He is awake. He is not in acute distress.    Appearance: He is well-developed and well-groomed. He is not ill-appearing or toxic-appearing.  HENT:     Head: Normocephalic.     Right Ear: Hearing and external ear normal.     Left Ear: Hearing and external ear normal.  Eyes:     General: Lids are normal.     Extraocular Movements: Extraocular movements intact.     Conjunctiva/sclera: Conjunctivae normal.  Neck:     Thyroid: No thyromegaly.     Vascular: No carotid bruit.     Comments: Decrease lateral movement neck and decreased flexion and extension noted.  No muscular tenderness on palpation.   Cardiovascular:     Rate and Rhythm: Normal rate and regular rhythm.     Heart sounds: Normal heart sounds.  Pulmonary:     Effort: No accessory muscle usage or respiratory distress.     Breath sounds: Normal breath sounds.  Abdominal:     General: Bowel sounds are normal. There is no distension.     Palpations: Abdomen is soft.     Tenderness: There is no abdominal tenderness.  Musculoskeletal:     Cervical back: No edema, erythema, torticollis or crepitus. Pain with movement present. No spinous process tenderness or muscular tenderness. Decreased range of motion.     Right lower leg: No edema.     Left lower leg: No edema.  Lymphadenopathy:     Cervical: No cervical adenopathy.  Skin:    General: Skin is warm.     Capillary Refill: Capillary refill takes less than 2 seconds.  Neurological:     Mental Status: He is alert and oriented to person, place, and time.     Deep Tendon Reflexes: Reflexes are normal and symmetric.     Reflex Scores:      Brachioradialis reflexes are 2+ on the right side and 2+ on the left  side.      Patellar reflexes are 2+ on the right side and 2+ on the left side. Psychiatric:        Attention and Perception: Attention normal.        Mood and Affect: Mood normal.        Speech: Speech normal.        Behavior: Behavior normal. Behavior is cooperative.        Thought Content:  Thought content normal.    Results for orders placed or performed in visit on 05/07/22  T3, free  Result Value Ref Range   T3, Free 2.8 2.0 - 4.4 pg/mL  Thyroglobulin antibody  Result Value Ref Range   Thyroglobulin Antibody 73.2 (H) 0.0 - 0.9 IU/mL  T4, free  Result Value Ref Range   Free T4 0.98 0.82 - 1.77 ng/dL  Thyroid peroxidase antibody  Result Value Ref Range   Thyroperoxidase Ab SerPl-aCnc >600 (H) 0 - 34 IU/mL  TSH  Result Value Ref Range   TSH 7.810 (H) 0.450 - 4.500 uIU/mL      Assessment & Plan:   Problem List Items Addressed This Visit       Endocrine   Hashimoto's thyroiditis    Diagnosed September 2023.  Continue Levothyroxine daily and adjust as needed.  TSH and Free T4 today.      Relevant Orders   T4, free   TSH     Other   Neck pain - Primary    Chronic, ongoing since June 2023.  He prefers no medications or injections + prefers no surgical intervention.  Would like to return to physical therapy which offered benefit.  Referral placed to PT.  Recommend he trial TENS machine.  May take Tylenol as needed, avoid Ibuprofen due to blood pressure.  Apply warmth as needed + try OTC Voltaren gel.      Relevant Orders   Ambulatory referral to Physical Therapy     Follow up plan: Return for as scheduled in August.

## 2022-11-05 NOTE — Assessment & Plan Note (Signed)
Chronic, ongoing since June 2023.  He prefers no medications or injections + prefers no surgical intervention.  Would like to return to physical therapy which offered benefit.  Referral placed to PT.  Recommend he trial TENS machine.  May take Tylenol as needed, avoid Ibuprofen due to blood pressure.  Apply warmth as needed + try OTC Voltaren gel.

## 2022-11-05 NOTE — Assessment & Plan Note (Signed)
Diagnosed September 2023.  Continue Levothyroxine daily and adjust as needed.  TSH and Free T4 today.

## 2022-11-06 LAB — TSH: TSH: 3.59 u[IU]/mL (ref 0.450–4.500)

## 2022-11-06 LAB — T4, FREE: Free T4: 1.08 ng/dL (ref 0.82–1.77)

## 2022-11-06 NOTE — Progress Notes (Signed)
Contacted via MyChart   Good evening Harrel, thyroid labs have returned and are in normal range.  Continue Levothyroxine dosing. Any questions? Keep being amazing!!  Thank you for allowing me to participate in your care.  I appreciate you. Kindest regards, Dashan Chizmar

## 2023-01-28 ENCOUNTER — Ambulatory Visit: Payer: Medicare Other

## 2023-01-28 ENCOUNTER — Ambulatory Visit (INDEPENDENT_AMBULATORY_CARE_PROVIDER_SITE_OTHER): Payer: Medicare Other | Admitting: Podiatry

## 2023-01-28 ENCOUNTER — Encounter: Payer: Self-pay | Admitting: Podiatry

## 2023-01-28 VITALS — BP 155/71 | HR 72

## 2023-01-28 DIAGNOSIS — M722 Plantar fascial fibromatosis: Secondary | ICD-10-CM | POA: Diagnosis not present

## 2023-01-28 MED ORDER — MELOXICAM 15 MG PO TABS: 15.0000 mg | ORAL_TABLET | Freq: Every day | ORAL | 1 refills | Status: DC

## 2023-01-28 NOTE — Progress Notes (Signed)
Chief Complaint  Patient presents with   Foot Pain    "I have pain in my left heel." N - heel pain L - left D - couple of months O - intermitten C - dull pain A - walking, strenuous work T - I saw Dr. Ether Griffins, ice, stretching exercise    Subjective: 72 y.o. male presenting today as a new patient for evaluation of left heel pain has been ongoing for few months now.  Presenting for second opinion.  He was seen by Dr. Ether Griffins at [facility] recommending conservative modalities including ice and stretching exercises which were demonstrated to him.  At that time the patient had declined cortisone injection.  He continues to have dull aching pain to the left heel.  Past Medical History:  Diagnosis Date   Aortic regurgitation    Arthritis    Asthma    Hernia, inguinal, recurrent    Nasal polyps    Pneumonia    PONV (postoperative nausea and vomiting)    Past Surgical History:  Procedure Laterality Date   CATARACT EXTRACTION W/ INTRAOCULAR LENS IMPLANT Left 10/19/2008   CATARACT EXTRACTION W/ INTRAOCULAR LENS IMPLANT Right 11/09/2008   ENDOVASCULAR REPAIR/STENT GRAFT N/A 12/08/2019   Procedure: ENDOVASCULAR REPAIR/STENT GRAFT;  Surgeon: Renford Dills, MD;  Location: [facility] INVASIVE CV LAB;  Service: Cardiovascular;  Laterality: N/A;   EYE SURGERY     INGUINAL HERNIA REPAIR Left    1999   INGUINAL HERNIA REPAIR Right    2019   KNEE SURGERY Left    NASAL POLYP EXCISION     1981, 1986, 1993, 1994, 1995, 1996, 1998, 1999, 2001   NASAL SEPTUM SURGERY     1981   REFRACTIVE SURGERY  2008   TOTAL KNEE ARTHROPLASTY Right 03/06/2021   TOTAL KNEE ARTHROPLASTY Left 09/28/2020   trigeminal nerve block  2003   TRIGEMINAL NERVE DECOMPRESSION  2002, 2003   WISDOM TOOTH EXTRACTION  1967   XI ROBOTIC ASSISTED INGUINAL HERNIA REPAIR WITH MESH Left 2019   Allergies  Allergen Reactions   Sulfites Swelling    Swelling of tongue and throat after eating lettuce, was told likely r/t  sulfites on produce at the time   Penicillins     Convulsions Did it involve swelling of the face/tongue/throat, SOB, or low BP? No Did it involve sudden or severe rash/hives, skin peeling, or any reaction on the inside of your mouth or nose? No Did you need to seek medical attention at a hospital or doctor's office? Yes When did it last happen?       If all above answers are "NO", may proceed with cephalosporin use.      Objective: Physical Exam General: The patient is alert and oriented x3 in no acute distress.  Dermatology: Skin is warm, dry and supple bilateral lower extremities. Negative for open lesions or macerations bilateral.   Vascular: Dorsalis Pedis and Posterior Tibial pulses palpable bilateral.  Capillary fill time is immediate to all digits.  Neurological: Grossly in tact via light touch  Musculoskeletal: There is some very mild tenderness to the lateral band of the plantar fascia left heel.  All other joints range of motion within normal limits bilateral. Strength 5/5 in all groups bilateral.   X-ray calcaneus left minimum 2 views 12/20/2022 [facility] Lateral and calcaneal axial of the left calcaneus reveals no signs of acute fracture or space-occupying lesions.  Subtalar joint is well maintained.  No space-occupying lesions are noted.  No varus or valgus of the calcaneus on the axial view.  Remainder of the calcaneal x-ray is negative.  Assessment: 1. Plantar fasciitis left foot  Plan of Care:  -Patient evaluated. Xrays reviewed that were taken at [facility].   -Today we discussed the pathology and etiology of inflammatory plantar fasciitis of the heel. -Discussed conservative treatment modalities including arch supports, antiinflammatories, icing and stretching exercises.  -Patient declined injection -Rx Meloxicam 15mg  daily -Rec OTC prefabricated insoles available on [employer], [employer] or [employer] -Return to clinic  PRN   Felecia Shelling, DPM [facility]  Dr. Felecia Shelling, DPM    [address] [place], [place] [address]                Office [phone]  Fax [phone]

## 2023-02-08 ENCOUNTER — Other Ambulatory Visit: Payer: Self-pay | Admitting: Nurse Practitioner

## 2023-02-10 NOTE — Telephone Encounter (Signed)
Requested Prescriptions  Pending Prescriptions Disp Refills   levothyroxine (SYNTHROID) 25 MCG tablet [Pharmacy Med Name: Levothyroxine Sodium 25 MCG Oral Tablet] 90 tablet 2    Sig: Take 1 tablet by mouth once daily     Endocrinology:  Hypothyroid Agents Passed - 02/08/2023 11:05 AM      Passed - TSH in normal range and within 360 days    TSH  Date Value Ref Range Status  11/05/2022 3.590 0.450 - 4.500 uIU/mL Final         Passed - Valid encounter within last 12 months    Recent Outpatient Visits           3 months ago Neck pain   [place] [place], [place] T, NP   10 months ago COPD with asthma (HCC)   [place] [facility] [place], Corrie Dandy T, NP   1 year ago Acute left-sided low back pain without sciatica   [facility] [place], Megan P, DO   2 years ago COPD with asthma (HCC)   [place] [facility] [place], Corrie Dandy T, NP   2 years ago Iliac artery aneurysm, left (HCC)   [place] [facility] [place], Dorie Rank, NP       Future Appointments             In 2 months [place], Dorie Rank, NP  [facility]

## 2023-03-03 ENCOUNTER — Telehealth: Payer: Self-pay | Admitting: Internal Medicine

## 2023-03-03 NOTE — Telephone Encounter (Signed)
Spoke with patients wife and she reports that they were on vacation in [place] when they found him deceased. She states it was do to a tear in his aorta. Funeral home needed some information and requested that I give them a call. She provided the number [phone]. Advised that I will give them a call to see what information is needed.    Called number provided and spoke with [facility]. She does not need anyone to sign death certificate and only needs last office visit note. Advised that he has not seen [place] here in the cardiology office in 2 years and it appears he last saw his PCP. Provided with their number so they could get records from them. She verbalized understanding and will reach out to them with number for PCP office. No further needs.   Contacted patients wife back and reviewed that all the facility needed was last office visit note but that he had not seen [place] in the past 2 years. Advised they will be reaching out to his PCP office for their records since they were the last one seeing him. Provided condolences to his wife and to give [place] a call back if he should have any further questions.

## 2023-03-03 NOTE — Telephone Encounter (Signed)
Pt spouse called in stating pt passed away due to a tear in his aorta. She states pt is at [facility] office. She states they need Dr. Okey Dupre to contact Karrie Doffing at [phone] and pt's death certificate. Please advise.

## 2023-03-27 DEATH — deceased

## 2023-04-11 ENCOUNTER — Ambulatory Visit: Payer: Medicare Other | Admitting: Nurse Practitioner

## 2023-09-08 IMAGING — US US RENAL
1 series · 15 of 25 positions shown · non-contrast
Comparison: None.

CLINICAL DATA: Follow up renal cysts

EXAM:
RENAL / URINARY TRACT ULTRASOUND COMPLETE

[Series 1: us renal · 15 of 70 slices shown]
[im 1/70]
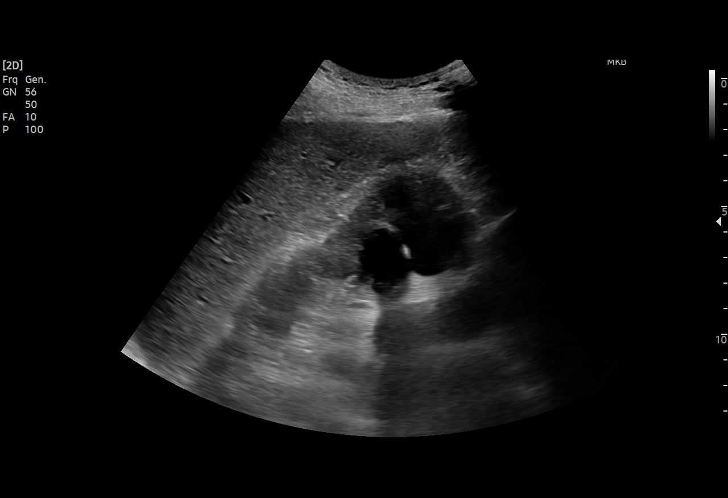
[im 6/70]
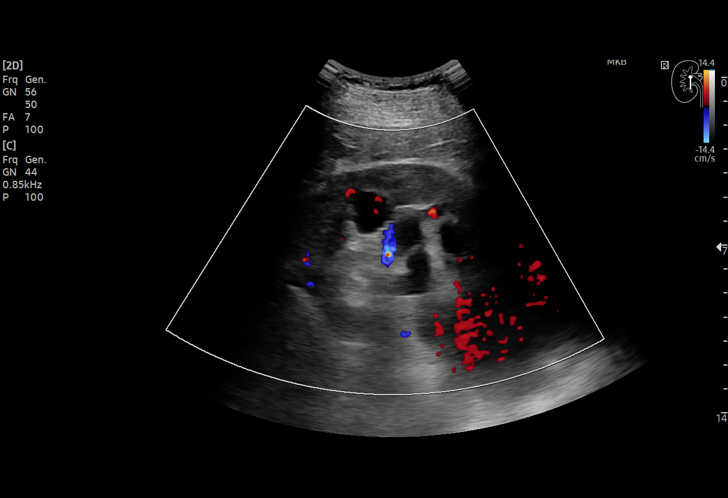
[im 12/70]
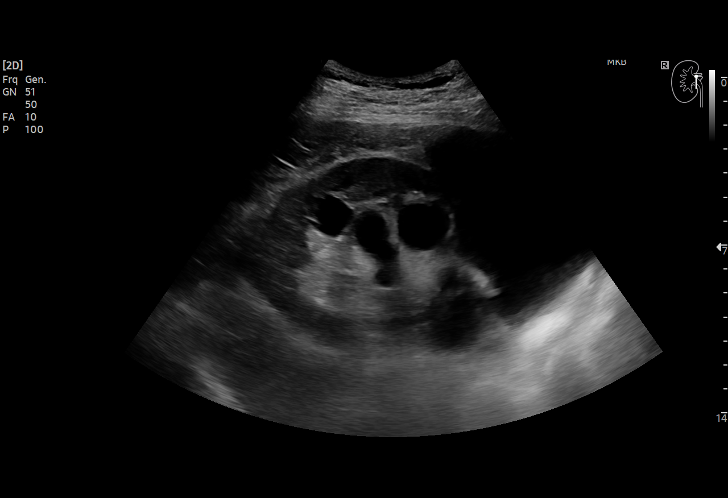
[im 15/70]
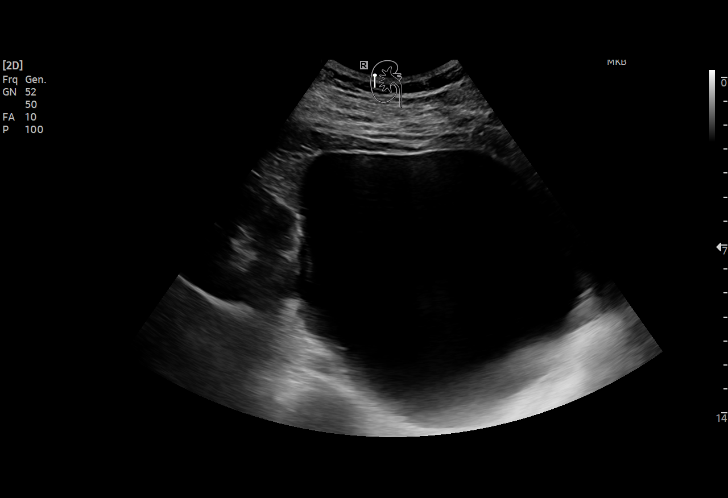
[im 21/70]
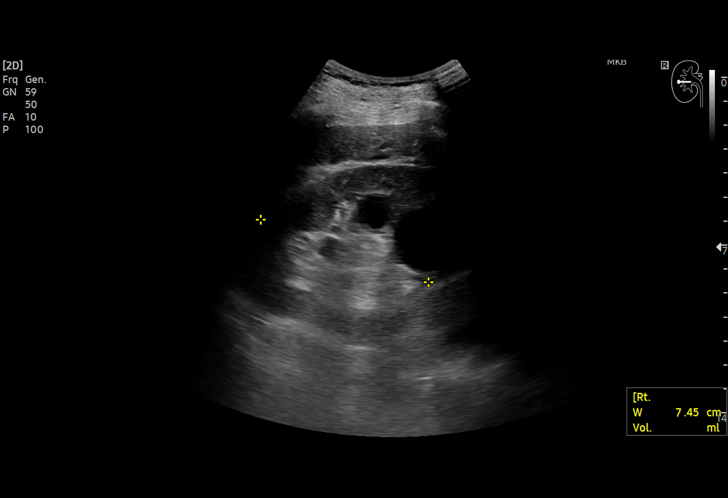
[im 26/70]
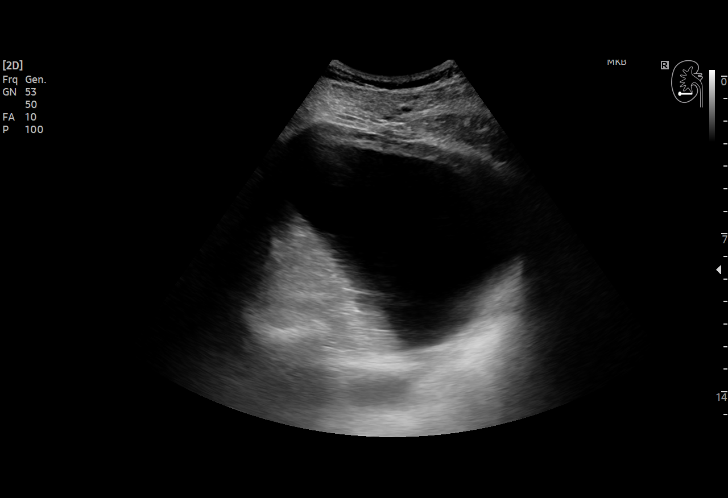
[im 29/70]
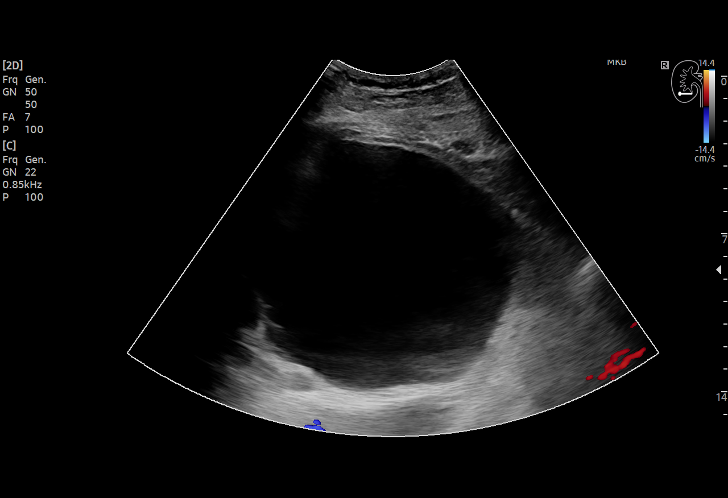
[im 35/70]
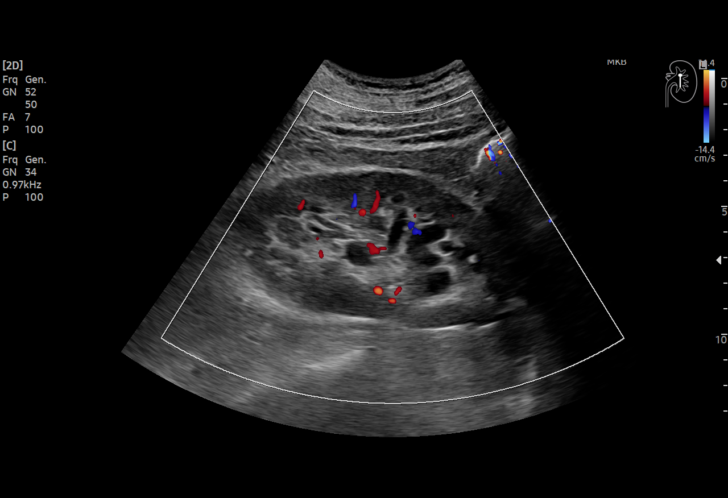
[im 41/70]
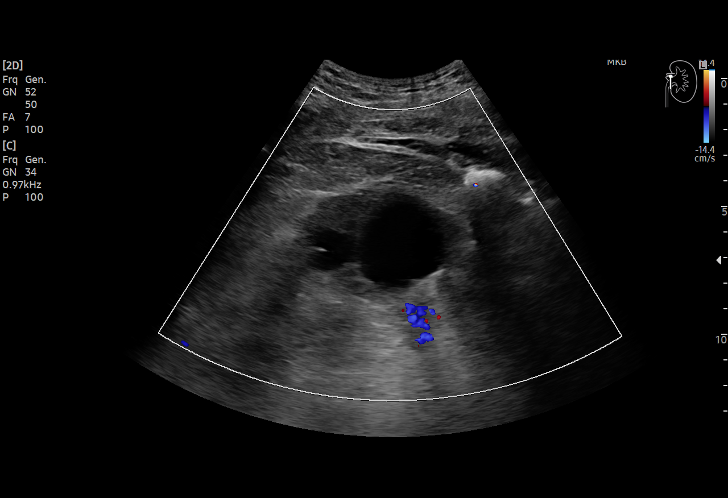
[im 44/70]
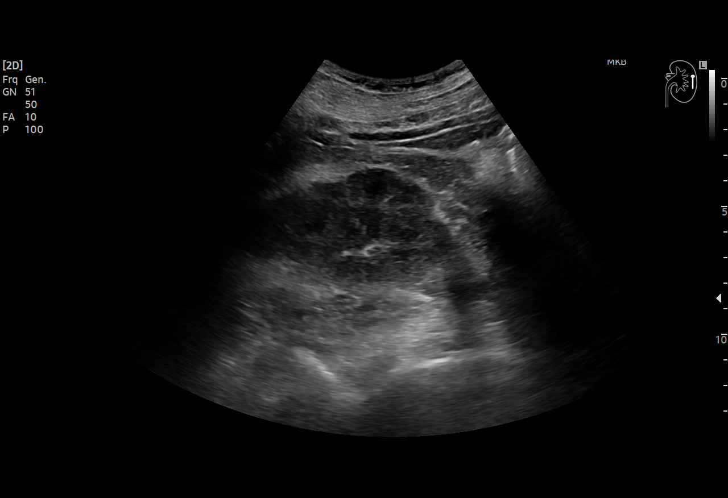
[im 49/70]
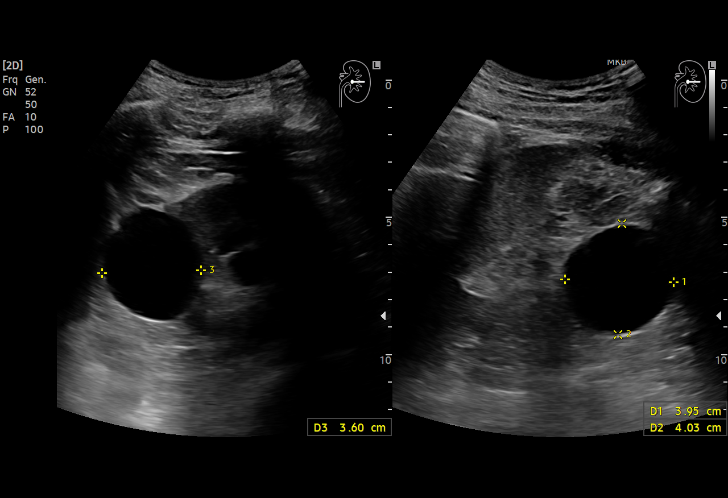
[im 55/70]
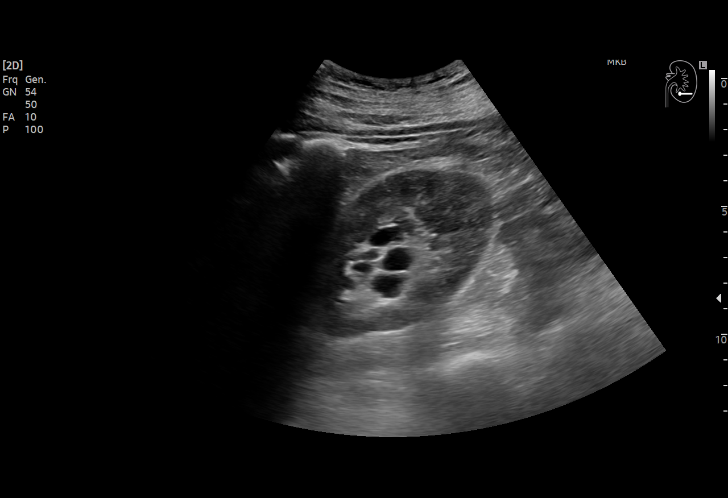
[im 58/70]
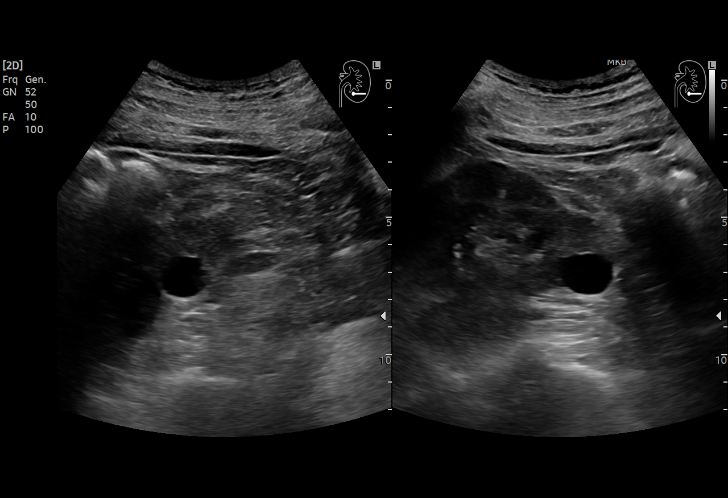
[im 64/70]
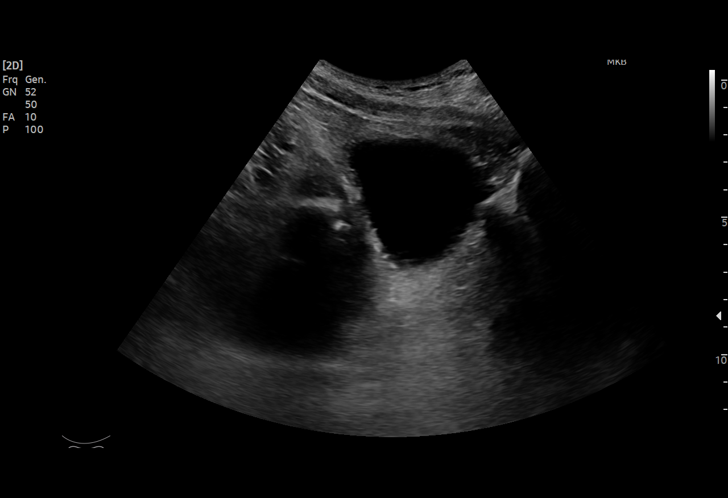
[im 70/70]
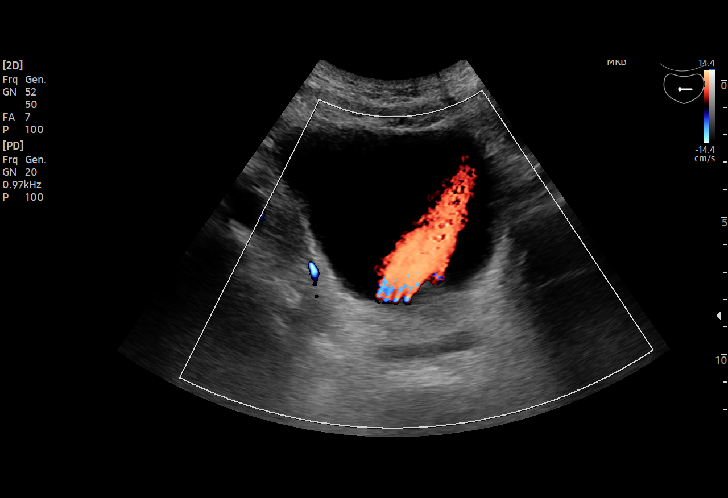

[15 of 25 positions shown; findings below may reference images not displayed]

FINDINGS: Right Kidney:

Renal measurements: 12.2 x 7.9 x 7.5 cm = volume: 378 mL. Multiple
renal cysts with the largest mass measuring 14.1cm with question of
an associated septation. Another 5.2 cm renal cyst with associated
thin septation that is poorly visualized. The other renal specific
to be simple in appearance. Echogenicity within normal limits. No
mass or hydronephrosis visualized.

Left Kidney:

Renal measurements: 11.4 x 5.9 x 5.7 cm = volume: 198 mL. Multiple
simple renal cysts with the largest mass measuring 4cm. Echogenicity
within normal limits. No mass or hydronephrosis visualized.

Urinary bladder:

Appears normal for degree of bladder distention.

Other:

None.
IMPRESSION: 1. Couple of minimally complex right renal cyst measuring 14.1 cm
and 5.2 cm. Slightly limited evaluation.
2. Otherwise bilateral simple renal cysts.
